# Patient Record
Sex: Female | Born: 1975 | Race: White | Hispanic: No | Smoking: Current some day smoker
Health system: Southern US, Community
[De-identification: ages and names within clinical notes are randomized; demographics above are authoritative.]

## PROBLEM LIST (undated history)

## (undated) DIAGNOSIS — E785 Hyperlipidemia, unspecified: Secondary | ICD-10-CM

## (undated) DIAGNOSIS — M5126 Other intervertebral disc displacement, lumbar region: Secondary | ICD-10-CM

## (undated) DIAGNOSIS — M1990 Unspecified osteoarthritis, unspecified site: Secondary | ICD-10-CM

## (undated) DIAGNOSIS — F4310 Post-traumatic stress disorder, unspecified: Secondary | ICD-10-CM

## (undated) DIAGNOSIS — F319 Bipolar disorder, unspecified: Secondary | ICD-10-CM

## (undated) DIAGNOSIS — F329 Major depressive disorder, single episode, unspecified: Secondary | ICD-10-CM

## (undated) DIAGNOSIS — A048 Other specified bacterial intestinal infections: Secondary | ICD-10-CM

## (undated) DIAGNOSIS — K219 Gastro-esophageal reflux disease without esophagitis: Secondary | ICD-10-CM

## (undated) DIAGNOSIS — J309 Allergic rhinitis, unspecified: Secondary | ICD-10-CM

## (undated) DIAGNOSIS — G43909 Migraine, unspecified, not intractable, without status migrainosus: Secondary | ICD-10-CM

## (undated) DIAGNOSIS — M4800 Spinal stenosis, site unspecified: Secondary | ICD-10-CM

## (undated) DIAGNOSIS — R102 Pelvic and perineal pain: Secondary | ICD-10-CM

## (undated) DIAGNOSIS — F419 Anxiety disorder, unspecified: Secondary | ICD-10-CM

## (undated) DIAGNOSIS — F32A Depression, unspecified: Secondary | ICD-10-CM

## (undated) DIAGNOSIS — M549 Dorsalgia, unspecified: Secondary | ICD-10-CM

## (undated) DIAGNOSIS — G8929 Other chronic pain: Secondary | ICD-10-CM

## (undated) DIAGNOSIS — N809 Endometriosis, unspecified: Secondary | ICD-10-CM

## (undated) DIAGNOSIS — M2550 Pain in unspecified joint: Secondary | ICD-10-CM

## (undated) DIAGNOSIS — M542 Cervicalgia: Secondary | ICD-10-CM

## (undated) DIAGNOSIS — M5430 Sciatica, unspecified side: Secondary | ICD-10-CM

## (undated) HISTORY — PX: SHOULDER SURGERY: SHX246

## (undated) HISTORY — PX: GALLBLADDER SURGERY: SHX652

## (undated) HISTORY — PX: ABDOMINAL HYSTERECTOMY: SHX81

## (undated) HISTORY — PX: CHOLECYSTECTOMY: SHX55

## (undated) HISTORY — DX: Unspecified osteoarthritis, unspecified site: M19.90

## (undated) HISTORY — PX: CERVICAL FUSION: SHX112

---

## 2005-01-21 HISTORY — PX: OTHER SURGICAL HISTORY: SHX169

## 2013-09-28 ENCOUNTER — Emergency Department (HOSPITAL_COMMUNITY)
Admission: EM | Admit: 2013-09-28 | Discharge: 2013-09-28 | Disposition: A | Discharge status: Home / Self Care | Payer: Medicaid Other | Attending: Emergency Medicine | Admitting: Emergency Medicine

## 2013-09-28 ENCOUNTER — Encounter (HOSPITAL_COMMUNITY): Payer: Self-pay | Admitting: Emergency Medicine

## 2013-09-28 DIAGNOSIS — M545 Low back pain, unspecified: Secondary | ICD-10-CM | POA: Diagnosis not present

## 2013-09-28 DIAGNOSIS — IMO0002 Reserved for concepts with insufficient information to code with codable children: Secondary | ICD-10-CM | POA: Insufficient documentation

## 2013-09-28 DIAGNOSIS — Z8742 Personal history of other diseases of the female genital tract: Secondary | ICD-10-CM | POA: Diagnosis not present

## 2013-09-28 DIAGNOSIS — Z862 Personal history of diseases of the blood and blood-forming organs and certain disorders involving the immune mechanism: Secondary | ICD-10-CM | POA: Diagnosis not present

## 2013-09-28 DIAGNOSIS — Z79899 Other long term (current) drug therapy: Secondary | ICD-10-CM | POA: Diagnosis not present

## 2013-09-28 DIAGNOSIS — F329 Major depressive disorder, single episode, unspecified: Secondary | ICD-10-CM | POA: Insufficient documentation

## 2013-09-28 DIAGNOSIS — Z791 Long term (current) use of non-steroidal anti-inflammatories (NSAID): Secondary | ICD-10-CM | POA: Insufficient documentation

## 2013-09-28 DIAGNOSIS — Z8619 Personal history of other infectious and parasitic diseases: Secondary | ICD-10-CM | POA: Diagnosis not present

## 2013-09-28 DIAGNOSIS — Z8739 Personal history of other diseases of the musculoskeletal system and connective tissue: Secondary | ICD-10-CM | POA: Diagnosis not present

## 2013-09-28 DIAGNOSIS — Z8719 Personal history of other diseases of the digestive system: Secondary | ICD-10-CM | POA: Diagnosis not present

## 2013-09-28 DIAGNOSIS — F17200 Nicotine dependence, unspecified, uncomplicated: Secondary | ICD-10-CM | POA: Insufficient documentation

## 2013-09-28 DIAGNOSIS — F411 Generalized anxiety disorder: Secondary | ICD-10-CM | POA: Diagnosis not present

## 2013-09-28 DIAGNOSIS — G43909 Migraine, unspecified, not intractable, without status migrainosus: Secondary | ICD-10-CM | POA: Insufficient documentation

## 2013-09-28 DIAGNOSIS — F3289 Other specified depressive episodes: Secondary | ICD-10-CM | POA: Insufficient documentation

## 2013-09-28 DIAGNOSIS — G8929 Other chronic pain: Secondary | ICD-10-CM | POA: Diagnosis not present

## 2013-09-28 DIAGNOSIS — Z8639 Personal history of other endocrine, nutritional and metabolic disease: Secondary | ICD-10-CM | POA: Insufficient documentation

## 2013-09-28 HISTORY — DX: Anxiety disorder, unspecified: F41.9

## 2013-09-28 HISTORY — DX: Sciatica, unspecified side: M54.30

## 2013-09-28 HISTORY — DX: Migraine, unspecified, not intractable, without status migrainosus: G43.909

## 2013-09-28 HISTORY — DX: Pain in unspecified joint: M25.50

## 2013-09-28 HISTORY — DX: Dorsalgia, unspecified: M54.9

## 2013-09-28 HISTORY — DX: Depression, unspecified: F32.A

## 2013-09-28 HISTORY — DX: Other intervertebral disc displacement, lumbar region: M51.26

## 2013-09-28 HISTORY — DX: Cervicalgia: M54.2

## 2013-09-28 HISTORY — DX: Hyperlipidemia, unspecified: E78.5

## 2013-09-28 HISTORY — DX: Spinal stenosis, site unspecified: M48.00

## 2013-09-28 HISTORY — DX: Major depressive disorder, single episode, unspecified: F32.9

## 2013-09-28 HISTORY — DX: Other specified bacterial intestinal infections: A04.8

## 2013-09-28 HISTORY — DX: Endometriosis, unspecified: N80.9

## 2013-09-28 HISTORY — DX: Other chronic pain: G89.29

## 2013-09-28 HISTORY — DX: Allergic rhinitis, unspecified: J30.9

## 2013-09-28 HISTORY — DX: Gastro-esophageal reflux disease without esophagitis: K21.9

## 2013-09-28 HISTORY — DX: Unspecified osteoarthritis, unspecified site: M19.90

## 2013-09-28 HISTORY — DX: Pelvic and perineal pain: R10.2

## 2013-09-28 MED ORDER — OXYCODONE-ACETAMINOPHEN 5-325 MG PO TABS
1.0000 | ORAL_TABLET | Freq: Once | ORAL | Status: AC
  Administered 2013-09-28: 1 via ORAL
  Filled 2013-09-28: qty 1

## 2013-09-28 MED ORDER — DIAZEPAM 5 MG PO TABS
5.0000 mg | ORAL_TABLET | Freq: Once | ORAL | Status: AC
  Administered 2013-09-28: 5 mg via ORAL
  Filled 2013-09-28: qty 1

## 2013-09-28 MED ORDER — OXYCODONE-ACETAMINOPHEN 5-325 MG PO TABS: 1.0000 | ORAL_TABLET | Freq: Once | ORAL | Status: DC

## 2013-09-28 MED ORDER — CYCLOBENZAPRINE HCL 10 MG PO TABS: 10.0000 mg | ORAL_TABLET | Freq: Two times a day (BID) | ORAL | Status: DC | PRN

## 2013-09-28 NOTE — ED Notes (Signed)
The patient says she has several herniated discs, and has chronic back pain.  She says she has been to urgent care for the back pain and they gave her steroids.  She says this time it is different.  She also say she has had incontinence of urine and stool.  She rates her pain 10/10.  She says she takes ibuprofen and it is not working.  She also says she was given hyrdocodone and that did not work.

## 2013-09-28 NOTE — Discharge Instructions (Signed)
Return to the emergency room with worsening of symptoms, new symptoms or with symptoms that are concerning, severe back pain, worsening loss of bladder or bowel, weakness, numbness and tingling. Follow up with orthopedist if symptoms are persistent. Take flexeril for severe pain Do not operate machinery, drive or drink alcohol while taking narcotics or muscle relaxants.  Back Pain:  Your back pain should be treated with medicines such as ibuprofen or aleve and this back pain should get better over the next 2 weeks.  However if you develop severe or worsening pain, low back pain with fever, numbness, weakness or inability to walk or urinate, you should return to the ER immediately.  Please follow up with your doctor this week for a recheck if still having symptoms. Low back pain is discomfort in the lower back that may be due to injuries to muscles and ligaments around the spine.  Occasionally, it may be caused by a a problem to a part of the spine called a disc.  The pain may last several days or a week;  However, most patients get completely well in 4 weeks.  Self - care:  The application of heat can help soothe the pain.  Maintaining your daily activities, including walking, is encourged, as it will help you get better faster than just staying in bed.  Medications are also useful to help with pain control.  A commonly prescribed medications includes acetaminophen.  This medication is generally safe, though you should not take more than 8 of the extra strength ( ) pills a day.  Non steroidal anti inflammatory medications including Ibuprofen and naproxen;  These medications help both pain and swelling and are very useful in treating back pain.  They should be taken with food, as they can cause stomach upset, and more seriously, stomach bleeding.    Muscle relaxants:  These medications can help with muscle tightness that is a cause of lower back pain.  Most of these medications can cause  drowsiness, and it is not safe to drive or use dangerous machinery while taking them.  You will need to follow up with  Your primary healthcare provider in 1-2 weeks for reassessment.  Be aware that if you develop new symptoms, such as a fever, leg weakness, difficulty with or loss of control of your urine or bowels, abdominal pain, or more severe pain, you will need to seek medical attention and  / or return to the Emergency department.

## 2013-09-28 NOTE — ED Provider Notes (Signed)
CSN: [id number]     Arrival date & time 09/28/13  1202 History   First MD Initiated Contact with Patient 09/28/13 1702     Chief Complaint  Patient presents with  . Back Pain    The patient says she has several herniated discs, and has chronic back pain.  She says she has been to urgent care for the back pain and they gave her steroids.  She says this time it is different.     HPI Nancy Mcpherson is a 38 y.o. female with PMH of chronic back pain, several herniated discs 01/2013, spinal stenosis, DJD, anxiety and depression presenting with chronic back pain that is worse than normal. Patient states that the pain is different in that she feels her bones are "stacked on top of each other." She was seen by urgent care who gave her steroids. She has been using ibuprofen, hydrocodone without relief. Patient also complains of mild urinary and fecal incontinence that has been going on since 2011. Patient states incontinence is worse but cannot specify in what way. Incontinence happens at rest, with activity and is enough to soak her underwear but she does not completely loose all contents of her bladder. Patient denies urinary retention. Patient denies dysuria, frequency, hematuria or foul odor to urine. Patient endorses some numbness in toes. Patient denies saddle anesthesia, weakness, fevers, chills, weight loss or night sweats or h/o cancer. Patient denies any abdominal pain. Patient is new to the area and without a PCP. Patient denies illicit drug use or IVDU. Patient denies any recent back injury or trauma or MVC.   Past Medical History  Diagnosis Date  . Depression   . Migraine   . Anxiety   . Allergic rhinitis   . GERD (gastroesophageal reflux disease)   . Endometriosis   . H. pylori infection   . Pelvic pain in female   . Neck pain   . Chronic back pain   . DJD (degenerative joint disease)   . Arthralgia   . Sciatica   . Hyperlipidemia   . Spinal stenosis   . Lumbar disc herniation     Past Surgical History  Procedure Laterality Date  . Abdominal hysterectomy    . Cesarean section    . Cholecystectomy     History reviewed. No pertinent family history. History  Substance Use Topics  . Smoking status: Current Every Day Smoker -- 1.00 packs/day    Types: Cigarettes  . Smokeless tobacco: Never Used  . Alcohol Use: No   OB History   Grav Para Term Preterm Abortions TAB SAB Ect Mult Living                 Review of Systems  Constitutional: Negative for fever and chills.  HENT: Negative for congestion and rhinorrhea.   Eyes: Negative for visual disturbance.  Respiratory: Negative for cough and shortness of breath.   Cardiovascular: Negative for chest pain and palpitations.  Gastrointestinal: Negative for nausea, vomiting and diarrhea.  Genitourinary: Negative for dysuria and hematuria.  Musculoskeletal: Positive for back pain. Negative for gait problem.  Skin: Negative for rash.  Neurological: Negative for weakness and headaches.      Allergies  Review of patient's allergies indicates no known allergies.  Home Medications   Prior to Admission medications   Medication Sig Start Date End Date Taking? Authorizing Provider  B Complex-C (SUPER B COMPLEX PO) Take 1 tablet by mouth daily.   Yes Historical Provider, MD  cholecalciferol (VITAMIN D)  1000 UNITS tablet Take 1,000 Units by mouth daily.   Yes Historical Provider, MD  FLUoxetine (PROZAC) 20 MG tablet Take 20 mg by mouth 3 (three) times daily.   Yes Historical Provider, MD  gabapentin (NEURONTIN) 300 MG capsule Take 300 mg by mouth 3 (three) times daily.   Yes Historical Provider, MD  HYDROcodone-acetaminophen (NORCO/VICODIN) 5-325 MG per tablet Take 1 tablet by mouth every 6 (six) hours as needed for moderate pain.   Yes Historical Provider, MD  ibuprofen (ADVIL,MOTRIN) 800 MG tablet Take 800 mg by mouth 2 (two) times daily. Take two every day per patient   Yes Historical Provider, MD  lamoTRIgine  (LAMICTAL) 150 MG tablet Take 150 mg by mouth daily.   Yes Historical Provider, MD  prazosin (MINIPRESS) 1 MG capsule Take 1 mg by mouth at bedtime.   Yes Historical Provider, MD  predniSONE (DELTASONE) 10 MG tablet Take 10 mg by mouth daily with breakfast.   Yes Historical Provider, MD  QUEtiapine (SEROQUEL) 100 MG tablet Take 100 mg by mouth at bedtime.   Yes Historical Provider, MD  QUEtiapine (SEROQUEL) 25 MG tablet Take 25 mg by mouth daily as needed.    Yes Historical Provider, MD  SUMAtriptan (IMITREX) 50 MG tablet Take 50 mg by mouth every 2 (two) hours as needed for headache. May repeat in 2 hours if headache persists or recurs.   Yes Historical Provider, MD  topiramate (TOPAMAX) 100 MG tablet Take 100 mg by mouth at bedtime.   Yes Historical Provider, MD  cyclobenzaprine (FLEXERIL) 10 MG tablet Take 1 tablet (10 mg total) by mouth 2 (two) times daily as needed for muscle spasms. 09/28/13   Benetta Spar L Khaniya Tenaglia, PA-C   BP 109/61  Pulse 78  Temp(Src) 98 F (36.7 C) (Oral)  Resp 16  SpO2 100% Physical Exam  Nursing note and vitals reviewed. Constitutional: She appears well-developed and well-nourished. No distress.  HENT:  Head: Normocephalic and atraumatic.  Eyes: Conjunctivae and EOM are normal. Right eye exhibits no discharge. Left eye exhibits no discharge. No scleral icterus.  Cardiovascular: Normal rate, regular rhythm and normal heart sounds.   Pulmonary/Chest: Effort normal and breath sounds normal. No respiratory distress. She has no wheezes.  Abdominal: Soft. Bowel sounds are normal. She exhibits no distension. There is no tenderness.  Musculoskeletal: Normal range of motion.  Patient without midline tenderness, crepitus or step off. Patient with right sided low back pain. Negative straight leg test. Patient with normal muscle tone, 5/5 strength in lower extremities. Sensation intact. DTR intact. Pedal pulses 2+. No CVA tenderness.  Neurological: She is alert. She exhibits  normal muscle tone. Coordination normal.  Skin: Skin is warm and dry. She is not diaphoretic.  Psychiatric: She has a normal mood and affect. Her behavior is normal.    ED Course  Procedures (including critical care time) Labs Review Labs Reviewed - No data to display  Imaging Review No results found.   EKG Interpretation None     Meds given in ED:  Medications  oxyCODONE-acetaminophen (PERCOCET/ROXICET) 5-325 MG per tablet 1 tablet (1 tablet Oral Given 09/28/13 1756)  diazepam (VALIUM) tablet 5 mg (5 mg Oral Given 09/28/13 1857)    Discharge Medication List as of 09/28/2013  7:12 PM    START taking these medications   Details  cyclobenzaprine (FLEXERIL) 10 MG tablet Take 1 tablet (10 mg total) by mouth 2 (two) times daily as needed for muscle spasms., Starting 09/28/2013, Until Discontinued, Print  MDM   Final diagnoses:  Chronic lower back pain   Nancy Mcpherson is a 38 y.o. female with PMH of chronic back pain, depression, anxiety presenting with back pain and urinary and fecal incontinence that has been ongoing since 2011. She says the incontinence is worse today but cannot specify in what way.  Incontinence not concerning for cauda equina syndrome due to its long standing duration and no new back injury. Patient is ambulatory, without saddle anesthesia, weakness. No fever, night sweats, weight loss, h/o cancer, IVDU.  VSS. No neurological deficits and normal neuro exam.  Patient can walk but states is painful. Patient given pain meds in ED with improvement of pain. RICE protocol and NSAIDs indicated and discussed with patient. Trial of flexeril. Sedation and driving precautions provided. Follow up with orthopedist for persistent symptoms. Patient without PCP and encouraged to establish care. ED resources provided.  Discussed return precautions with patient. Discussed all results and patient verbalizes understanding and agrees with plan.  Case has been discussed with  Dr. Gwendolyn Grant who agrees with the above plan to discharge.      Louann Sjogren, PA-C 09/29/13 1158

## 2013-09-29 NOTE — ED Provider Notes (Signed)
Medical screening examination/treatment/procedure(s) were performed by non-physician practitioner and as supervising physician I was immediately available for consultation/collaboration.   EKG Interpretation None        Elwin Mocha, MD 09/29/13 [phone]

## 2013-11-25 ENCOUNTER — Encounter (HOSPITAL_COMMUNITY): Payer: Self-pay | Admitting: *Deleted

## 2013-11-25 ENCOUNTER — Inpatient Hospital Stay (HOSPITAL_COMMUNITY): Payer: Medicaid Other

## 2013-11-25 ENCOUNTER — Emergency Department (HOSPITAL_COMMUNITY): Payer: Medicaid Other

## 2013-11-25 ENCOUNTER — Inpatient Hospital Stay (HOSPITAL_COMMUNITY)
Admission: EM | Admit: 2013-11-25 | Discharge: 2013-11-26 | DRG: 070 | Disposition: A | Discharge status: Home / Self Care | Payer: Medicaid Other | Attending: Pulmonary Disease | Admitting: Pulmonary Disease

## 2013-11-25 DIAGNOSIS — R4182 Altered mental status, unspecified: Secondary | ICD-10-CM

## 2013-11-25 DIAGNOSIS — F17210 Nicotine dependence, cigarettes, uncomplicated: Secondary | ICD-10-CM | POA: Diagnosis present

## 2013-11-25 DIAGNOSIS — R110 Nausea: Secondary | ICD-10-CM | POA: Diagnosis not present

## 2013-11-25 DIAGNOSIS — F3130 Bipolar disorder, current episode depressed, mild or moderate severity, unspecified: Secondary | ICD-10-CM | POA: Diagnosis present

## 2013-11-25 DIAGNOSIS — J9601 Acute respiratory failure with hypoxia: Secondary | ICD-10-CM

## 2013-11-25 DIAGNOSIS — R4020 Unspecified coma: Secondary | ICD-10-CM | POA: Diagnosis present

## 2013-11-25 DIAGNOSIS — G9340 Encephalopathy, unspecified: Secondary | ICD-10-CM | POA: Diagnosis present

## 2013-11-25 DIAGNOSIS — R000 Tachycardia, unspecified: Secondary | ICD-10-CM | POA: Diagnosis present

## 2013-11-25 DIAGNOSIS — J9600 Acute respiratory failure, unspecified whether with hypoxia or hypercapnia: Secondary | ICD-10-CM | POA: Diagnosis present

## 2013-11-25 DIAGNOSIS — F4310 Post-traumatic stress disorder, unspecified: Secondary | ICD-10-CM | POA: Diagnosis present

## 2013-11-25 DIAGNOSIS — T1490XA Injury, unspecified, initial encounter: Secondary | ICD-10-CM | POA: Insufficient documentation

## 2013-11-25 DIAGNOSIS — R55 Syncope and collapse: Secondary | ICD-10-CM

## 2013-11-25 DIAGNOSIS — I10 Essential (primary) hypertension: Secondary | ICD-10-CM | POA: Diagnosis present

## 2013-11-25 DIAGNOSIS — E876 Hypokalemia: Secondary | ICD-10-CM | POA: Diagnosis present

## 2013-11-25 DIAGNOSIS — J019 Acute sinusitis, unspecified: Secondary | ICD-10-CM | POA: Diagnosis present

## 2013-11-25 DIAGNOSIS — R40243 Glasgow coma scale score 3-8, unspecified time: Secondary | ICD-10-CM

## 2013-11-25 DIAGNOSIS — R569 Unspecified convulsions: Secondary | ICD-10-CM | POA: Diagnosis present

## 2013-11-25 HISTORY — DX: Bipolar disorder, unspecified: F31.9

## 2013-11-25 HISTORY — DX: Post-traumatic stress disorder, unspecified: F43.10

## 2013-11-25 LAB — I-STAT ARTERIAL BLOOD GAS, ED
Acid-base deficit: 2 mmol/L (ref 0.0–2.0)
BICARBONATE: 24.2 meq/L — AB (ref 20.0–24.0)
O2 Saturation: 100 %
TCO2: 26 mmol/L (ref 0–100)
pCO2 arterial: 46.2 mmHg — ABNORMAL HIGH (ref 35.0–45.0)
pH, Arterial: 7.327 — ABNORMAL LOW (ref 7.350–7.450)
pO2, Arterial: 491 mmHg — ABNORMAL HIGH (ref 80.0–100.0)

## 2013-11-25 LAB — URINALYSIS, ROUTINE W REFLEX MICROSCOPIC
Glucose, UA: NEGATIVE mg/dL
Hgb urine dipstick: NEGATIVE
Ketones, ur: NEGATIVE mg/dL
Leukocytes, UA: NEGATIVE
NITRITE: NEGATIVE
Protein, ur: NEGATIVE mg/dL
SPECIFIC GRAVITY, URINE: 1.035 — AB (ref 1.005–1.030)
UROBILINOGEN UA: 0.2 mg/dL (ref 0.0–1.0)
pH: 5 (ref 5.0–8.0)

## 2013-11-25 LAB — TYPE AND SCREEN
ABO/RH(D): O POS
Antibody Screen: NEGATIVE
Unit division: 0
Unit division: 0

## 2013-11-25 LAB — I-STAT CHEM 8, ED
BUN: 4 mg/dL — AB (ref 6–23)
CALCIUM ION: 1.02 mmol/L — AB (ref 1.12–1.23)
CREATININE: 0.6 mg/dL (ref 0.50–1.10)
Chloride: 107 mEq/L (ref 96–112)
GLUCOSE: 107 mg/dL — AB (ref 70–99)
HCT: 45 % (ref 36.0–46.0)
Hemoglobin: 15.3 g/dL — ABNORMAL HIGH (ref 12.0–15.0)
Potassium: 2.9 mEq/L — CL (ref 3.7–5.3)
Sodium: 140 mEq/L (ref 137–147)
TCO2: 19 mmol/L (ref 0–100)

## 2013-11-25 LAB — ACETAMINOPHEN LEVEL: Acetaminophen (Tylenol), Serum: <15 ug/mL (ref 10–30)

## 2013-11-25 LAB — PHOSPHORUS: Phosphorus: 3.1 mg/dL (ref 2.3–4.6)

## 2013-11-25 LAB — MAGNESIUM: Magnesium: 1.9 mg/dL (ref 1.5–2.5)

## 2013-11-25 LAB — TROPONIN I

## 2013-11-25 LAB — SALICYLATE LEVEL: Salicylate Lvl: <2 mg/dL — ABNORMAL LOW (ref 2.8–20.0)

## 2013-11-25 LAB — MRSA PCR SCREENING: MRSA BY PCR: NEGATIVE

## 2013-11-25 LAB — LACTIC ACID, PLASMA: Lactic Acid, Venous: 1.3 mmol/L (ref 0.5–2.2)

## 2013-11-25 LAB — I-STAT CG4 LACTIC ACID, ED: Lactic Acid, Venous: 1.05 mmol/L (ref 0.5–2.2)

## 2013-11-25 LAB — ABO/RH: ABO/RH(D): O POS

## 2013-11-25 MED ORDER — FENTANYL CITRATE 0.05 MG/ML IJ SOLN
50.0000 ug | Freq: Once | INTRAMUSCULAR | Status: AC
  Administered 2013-11-25: 50 ug via INTRAVENOUS

## 2013-11-25 MED ORDER — HYDROMORPHONE HCL 1 MG/ML IJ SOLN
1.0000 mg | Freq: Once | INTRAMUSCULAR | Status: AC
  Administered 2013-11-25: 1 mg via INTRAVENOUS
  Filled 2013-11-25: qty 1

## 2013-11-25 MED ORDER — GADOBENATE DIMEGLUMINE 529 MG/ML IV SOLN
15.0000 mL | Freq: Once | INTRAVENOUS | Status: AC | PRN
  Administered 2013-11-25: 15 mL via INTRAVENOUS

## 2013-11-25 MED ORDER — SODIUM CHLORIDE 0.9 % IV SOLN: 250.0000 mL | INTRAVENOUS | Status: DC | PRN

## 2013-11-25 MED ORDER — IOHEXOL 300 MG/ML  SOLN
80.0000 mL | Freq: Once | INTRAMUSCULAR | Status: AC | PRN
  Administered 2013-11-25: 80 mL via INTRAVENOUS

## 2013-11-25 MED ORDER — FENTANYL CITRATE 0.05 MG/ML IJ SOLN
INTRAMUSCULAR | Status: AC
  Filled 2013-11-25: qty 2

## 2013-11-25 MED ORDER — ROCURONIUM BROMIDE 50 MG/5ML IV SOLN
INTRAVENOUS | Status: AC | PRN
  Administered 2013-11-25: 100 mg via INTRAVENOUS

## 2013-11-25 MED ORDER — CHLORHEXIDINE GLUCONATE 0.12 % MT SOLN
15.0000 mL | Freq: Two times a day (BID) | OROMUCOSAL | Status: DC
  Administered 2013-11-26 (×2): 15 mL via OROMUCOSAL
  Filled 2013-11-25 (×2): qty 15

## 2013-11-25 MED ORDER — SODIUM CHLORIDE 0.9 % IV SOLN
INTRAVENOUS | Status: DC
  Administered 2013-11-25: 75 mL/h via INTRAVENOUS
  Administered 2013-11-26: 09:00:00 via INTRAVENOUS

## 2013-11-25 MED ORDER — FENTANYL CITRATE 0.05 MG/ML IJ SOLN
INTRAMUSCULAR | Status: AC | PRN
  Administered 2013-11-25: 50 ug via INTRAVENOUS

## 2013-11-25 MED ORDER — CETYLPYRIDINIUM CHLORIDE 0.05 % MT LIQD
7.0000 mL | Freq: Four times a day (QID) | OROMUCOSAL | Status: DC
  Administered 2013-11-26 (×2): 7 mL via OROMUCOSAL

## 2013-11-25 MED ORDER — MIDAZOLAM HCL 2 MG/2ML IJ SOLN
INTRAMUSCULAR | Status: AC
  Administered 2013-11-25: 4 mg via INTRAVENOUS
  Filled 2013-11-25: qty 4

## 2013-11-25 MED ORDER — POTASSIUM CHLORIDE 10 MEQ/100ML IV SOLN
10.0000 meq | Freq: Once | INTRAVENOUS | Status: AC
  Administered 2013-11-25: 10 meq via INTRAVENOUS
  Filled 2013-11-25: qty 100

## 2013-11-25 MED ORDER — PROPOFOL 10 MG/ML IV EMUL
5.0000 ug/kg/min | INTRAVENOUS | Status: DC
  Administered 2013-11-25: 30 ug/kg/min via INTRAVENOUS
  Administered 2013-11-25: 15 ug/kg/min via INTRAVENOUS
  Filled 2013-11-25: qty 100

## 2013-11-25 MED ORDER — MIDAZOLAM HCL 2 MG/2ML IJ SOLN
INTRAMUSCULAR | Status: AC
  Filled 2013-11-25: qty 4

## 2013-11-25 MED ORDER — PANTOPRAZOLE SODIUM 40 MG IV SOLR
40.0000 mg | Freq: Every day | INTRAVENOUS | Status: DC
  Administered 2013-11-26: 40 mg via INTRAVENOUS
  Filled 2013-11-25 (×2): qty 40

## 2013-11-25 MED ORDER — MIDAZOLAM HCL 5 MG/5ML IJ SOLN
4.0000 mg | Freq: Once | INTRAMUSCULAR | Status: AC
  Administered 2013-11-25 (×2): 4 mg via INTRAVENOUS

## 2013-11-25 MED ORDER — PROPOFOL 10 MG/ML IV EMUL
INTRAVENOUS | Status: AC
  Filled 2013-11-25: qty 100

## 2013-11-25 MED ORDER — LORAZEPAM 2 MG/ML IJ SOLN
5.0000 mg | Freq: Once | INTRAMUSCULAR | Status: DC
  Filled 2013-11-25: qty 3

## 2013-11-25 MED ORDER — ETOMIDATE 2 MG/ML IV SOLN
INTRAVENOUS | Status: AC | PRN
  Administered 2013-11-25: 20 mg via INTRAVENOUS

## 2013-11-25 NOTE — ED Notes (Addendum)
To CT with RN and MD after X-rays noted to be neg

## 2013-11-25 NOTE — ED Notes (Signed)
Preparing to intubate.  
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
## 2013-11-25 NOTE — Progress Notes (Signed)
eLink Physician-Brief Progress Note Patient Name: Nancy Mcpherson DOB: [birth year] MRN: [id number]   Date of Service  11/25/2013  HPI/Events of Note  Back from MRI, read pending but no gross abnormality per my read Awake and following commands Passing SBT  eICU Interventions  Extubate now     Intervention Category Major Interventions: Respiratory failure - evaluation and management  MCQUAID, DOUGLAS 11/25/2013, 11:44 PM
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
## 2013-11-25 NOTE — ED Notes (Signed)
Pt transported to CT ?

## 2013-11-25 NOTE — ED Provider Notes (Signed)
CSN: [id number]     Arrival date & time 11/25/13  1500 History   None    No chief complaint on file.    (Consider location/radiation/quality/duration/timing/severity/associated sxs/prior Treatment) HPI Comments: 38 y/o female found down outdoors at home. Reported in normal state of health prior. GCS 3 on arrival with shallow respirations. Wife reports that pt has history of depression and PTSD but otherwise no medical problems. Level 5 caveat  The history is provided by the EMS personnel. No language interpreter was used.    No past medical history on file. No past surgical history on file. No family history on file. History  Substance Use Topics  . Smoking status: Not on file  . Smokeless tobacco: Not on file  . Alcohol Use: Not on file   OB History    No data available     Review of Systems  Unable to perform ROS: Patient unresponsive      Allergies  Review of patient's allergies indicates not on file.  Home Medications   Prior to Admission medications   Not on File   BP 132/93 mmHg  Pulse 117  Temp(Src) 98.2 F (36.8 C) (Temporal)  Resp 25  Ht 5\' 5"  (1.651 m)  Wt 160 lb (72.576 kg)  BMI 26.63 kg/m2  SpO2 100%  LMP  (LMP Unknown) Physical Exam  Constitutional: She appears well-developed.  HENT:  Mouth/Throat: Oropharynx is clear and moist.  Abrasion to right forehead  Eyes: Pupils are equal, round, and reactive to light.  Left gaze deviation with opening eyes. fasiculations of eyelids  Neck: No tracheal deviation present.  Cardiovascular: Regular rhythm.  Tachycardia present.   Pulmonary/Chest:  Breath sounds equal, diminished. No crepitus to chest wall  Abdominal: Soft. She exhibits no distension.  Musculoskeletal:  No step offs of t/l spine. Cervical collar in place. Pelvis stable to compression. No deformity of extremities  Neurological: She is unresponsive. GCS eye subscore is 1. GCS verbal subscore is 1. GCS motor subscore is 1.  Skin: Skin is  warm.  Abrasion to right elbow    ED Course  INTUBATION Date/Time: 11/25/2013 3:15 PM Performed by: Abagail KitchensAYLOR, Arran Fessel Authorized by: Abagail KitchensAYLOR, Vicent Febles Consent: The procedure was performed in an emergent situation. Patient identity confirmed: arm band Time out: Immediately prior to procedure a "time out" was called to verify the correct patient, procedure, equipment, support staff and site/side marked as required. Indications: airway protection Intubation method: video-assisted Patient status: paralyzed (RSI) Preoxygenation: nonrebreather mask Sedatives: etomidate Paralytic: rocuronium Tube size: 7.5 mm Tube type: cuffed Number of attempts: 1 Cords visualized: yes Post-procedure assessment: chest rise and CO2 detector Breath sounds: equal and absent over the epigastrium Cuff inflated: yes ETT to teeth: 24 cm Tube secured with: ETT holder Chest x-ray interpreted by me. Chest x-ray findings: endotracheal tube too low Tube repositioned: tube repositioned successfully Patient tolerance: Patient tolerated the procedure well with no immediate complications  CRITICAL CARE Performed by: Abagail KitchensAYLOR, Kerrigan Gombos Authorized by: Abagail KitchensAYLOR, Lyal Husted Total critical care time: 30 minutes Critical care time was exclusive of separately billable procedures and treating other patients. Critical care was necessary to treat or prevent imminent or life-threatening deterioration of the following conditions: CNS failure or compromise. Critical care was time spent personally by me on the following activities: discussions with consultants, evaluation of patient's response to treatment, examination of patient, ordering and performing treatments and interventions, ordering and review of laboratory studies, ordering and review of radiographic studies and re-evaluation of patient's condition.   (including  critical care time) Labs Review Labs Reviewed  I-STAT CHEM 8, ED - Abnormal; Notable for the following:    Potassium 2.9 (*)     BUN 4 (*)    Glucose, Bld 107 (*)    Calcium, Ion 1.02 (*)    Hemoglobin 15.3 (*)    All other components within normal limits  TYPE AND SCREEN  PREPARE FRESH FROZEN PLASMA    Imaging Review Ct Head Wo Contrast  11/25/2013   CLINICAL DATA:  Found down, soft tissue swelling/ hematoma to right forehead  EXAM: CT HEAD WITHOUT CONTRAST  CT CERVICAL SPINE WITHOUT CONTRAST  TECHNIQUE: Multidetector CT imaging of the head and cervical spine was performed following the standard protocol without intravenous contrast. Multiplanar CT image reconstructions of the cervical spine were also generated.  COMPARISON:  None.  FINDINGS: CT HEAD FINDINGS  No evidence of parenchymal hemorrhage or extra-axial fluid collection. No mass lesion, mass effect, or midline shift.  No CT evidence of acute infarction.  Cerebral volume is within normal limits.  No ventriculomegaly.  Partial opacification/ mucous retention cyst in the right maxillary sinus. The visualized paranasal sinuses are otherwise essentially clear. The mastoid air cells are unopacified.  No evidence of calvarial fracture.  CT CERVICAL SPINE FINDINGS  Straightening of the cervical spine.  No evidence of fracture or dislocation. Vertebral body heights are maintained. Dens appears intact.  No prevertebral soft tissue swelling.  Mild degenerative changes at C5-6.  Visualized thyroid is unremarkable.  Visualized lung apices are clear.  IMPRESSION: Normal head CT.  Normal cervical spine CT.   Electronically Signed   By: Charline BillsSriyesh  Krishnan M.D.   On: 11/25/2013 16:10   Ct Chest W Contrast  11/25/2013   CLINICAL DATA:  Head trauma. Hematoma to the forehead. The patient was found down.  EXAM: CT CHEST AND ABDOMEN WITH CONTRAST  TECHNIQUE: Multidetector CT imaging of the chest and abdomen was performed following the standard protocol during bolus administration of intravenous contrast.  CONTRAST:  80mL OMNIPAQUE IOHEXOL 300 MG/ML  SOLN  COMPARISON:  None.  FINDINGS: CT  CHEST FINDINGS  There is slight dependent atelectasis at the lung bases. There is a 6 x 9 mm nodular density at the left lung base laterally best seen on image 44 of series 202. This is probably inflammatory in origin but I recommend a follow-up CT in 6 months without contrast to determine ongoing stability. Lungs are otherwise clear.  Heart and other mediastinal structures are normal. Endotracheal tube and NG tube are in good position. Osseous structures are normal. No pneumothorax.  CT ABDOMEN FINDINGS  Liver, spleen, pancreas, adrenal glands, and kidneys are normal. Gallbladder has been removed. Biliary tree is normal. NG tube tip is in the antrum of the stomach. There is no free air or free fluid in the abdomen. Bowel appears normal. The appendix is not visible. Uterus has been removed. Ovaries are normal. Bladder is normal. No significant acute osseous abnormality. Degenerative disc disease at L5-S1.  IMPRESSION: 1. Minimal dependent atelectasis at the lung bases. 6 x 9 mm nodule at the left lung base is most likely inflammatory but I recommend a six-month CT scan follow-up without contrast. 2. Benign appearing abdomen.   Electronically Signed   By: Geanie CooleyJim  Maxwell M.D.   On: 11/25/2013 16:25   Ct Cervical Spine Wo Contrast  11/25/2013   CLINICAL DATA:  Found down, soft tissue swelling/ hematoma to right forehead  EXAM: CT HEAD WITHOUT CONTRAST  CT CERVICAL SPINE  WITHOUT CONTRAST  TECHNIQUE: Multidetector CT imaging of the head and cervical spine was performed following the standard protocol without intravenous contrast. Multiplanar CT image reconstructions of the cervical spine were also generated.  COMPARISON:  None.  FINDINGS: CT HEAD FINDINGS  No evidence of parenchymal hemorrhage or extra-axial fluid collection. No mass lesion, mass effect, or midline shift.  No CT evidence of acute infarction.  Cerebral volume is within normal limits.  No ventriculomegaly.  Partial opacification/ mucous retention cyst in  the right maxillary sinus. The visualized paranasal sinuses are otherwise essentially clear. The mastoid air cells are unopacified.  No evidence of calvarial fracture.  CT CERVICAL SPINE FINDINGS  Straightening of the cervical spine.  No evidence of fracture or dislocation. Vertebral body heights are maintained. Dens appears intact.  No prevertebral soft tissue swelling.  Mild degenerative changes at C5-6.  Visualized thyroid is unremarkable.  Visualized lung apices are clear.  IMPRESSION: Normal head CT.  Normal cervical spine CT.   Electronically Signed   By: Charline Bills M.D.   On: 11/25/2013 16:10   Ct Abdomen Pelvis W Contrast  11/25/2013   CLINICAL DATA:  Head trauma. Hematoma to the forehead. The patient was found down.  EXAM: CT CHEST AND ABDOMEN WITH CONTRAST  TECHNIQUE: Multidetector CT imaging of the chest and abdomen was performed following the standard protocol during bolus administration of intravenous contrast.  CONTRAST:  80mL OMNIPAQUE IOHEXOL 300 MG/ML  SOLN  COMPARISON:  None.  FINDINGS: CT CHEST FINDINGS  There is slight dependent atelectasis at the lung bases. There is a 6 x 9 mm nodular density at the left lung base laterally best seen on image 44 of series 202. This is probably inflammatory in origin but I recommend a follow-up CT in 6 months without contrast to determine ongoing stability. Lungs are otherwise clear.  Heart and other mediastinal structures are normal. Endotracheal tube and NG tube are in good position. Osseous structures are normal. No pneumothorax.  CT ABDOMEN FINDINGS  Liver, spleen, pancreas, adrenal glands, and kidneys are normal. Gallbladder has been removed. Biliary tree is normal. NG tube tip is in the antrum of the stomach. There is no free air or free fluid in the abdomen. Bowel appears normal. The appendix is not visible. Uterus has been removed. Ovaries are normal. Bladder is normal. No significant acute osseous abnormality. Degenerative disc disease at  L5-S1.  IMPRESSION: 1. Minimal dependent atelectasis at the lung bases. 6 x 9 mm nodule at the left lung base is most likely inflammatory but I recommend a six-month CT scan follow-up without contrast. 2. Benign appearing abdomen.   Electronically Signed   By: Geanie Cooley M.D.   On: 11/25/2013 16:25   Dg Pelvis Portable  11/25/2013   CLINICAL DATA:  Found unresponsive on the ground 1 hr ago.  EXAM: PORTABLE PELVIS 1-2 VIEWS  COMPARISON:  None.  FINDINGS: The upper portion the pelvis is not included. The included portions appear normal without fracture or dislocation.  IMPRESSION: Excluded upper pelvis with no abnormality seen.   Electronically Signed   By: Gordan Payment M.D.   On: 11/25/2013 15:35   Dg Chest Portable 1 View  11/25/2013   CLINICAL DATA:  ET tube placement.  Found unresponsive.  EXAM: PORTABLE CHEST - 1 VIEW  COMPARISON:  None.  FINDINGS: The endotracheal tube tip just above the level of the carina. There is a nasogastric tube with tip in the stomach. Normal heart size. No pleural effusion or  edema.  IMPRESSION: Satisfactory position of the ET tube with tip above the carina.   Electronically Signed   By: Signa Kell M.D.   On: 11/25/2013 15:49     EKG Interpretation   Date/Time:  Thursday November 25 2013 15:05:26 EST Ventricular Rate:  113 PR Interval:  171 QRS Duration: 77 QT Interval:  336 QTC Calculation: 461 R Axis:   33 Text Interpretation:  Sinus tachycardia Biatrial enlargement Anteroseptal  infarct, age indeterminate ST depression in Lateral leads Confirmed by  ZACKOWSKI  MD, SCOTT [phone]) on 11/25/2013 3:56:09 PM      MDM   Final diagnoses:  Glasgow coma scale total score 3-8    38 y/o female presenting unresponsive after found down outdoors, level 1 trauma. Last seen normal 15 min prior.  GCS 3. Intubated on arrival for airway protection. No hypotension or hypoxia. Minimal trauma on exam. Fast negative. CT head without acute abnormality. Suspect metabolic vs  seizure. Neurology evaluated and suspect seizure activity. Recommend EEG and MRI. Will admit to Critical Care.     Abagail Kitchens, MD 11/25/13 [phone]

## 2013-11-25 NOTE — H&P (Signed)
PULMONARY / CRITICAL CARE MEDICINE   Name: Nancy Mcpherson MRN: [id number] DOB: 01-Apr-1975    ADMISSION DATE:  11/25/2013  REFERRING MD :  EDP  CHIEF COMPLAINT:  AMS  INITIAL PRESENTATION: 38yo female with hx bipolar, depression, PTSD found down by partner outside.  GCS 3 on arrival to ER.  Intubated, PCCM called to admit.   STUDIES:  CT head 11/5>>>neg  CT chest 11/5>>> neg acute.  6x1049mm LLL nodule likely inflammatory  CT abd/pelvis 11/5>>> neg acute   SIGNIFICANT EVENTS:    HISTORY OF PRESENT ILLNESS:  38 yo female with hx bipolar, depression, PTSD found down by partner outside. She was in her usual state of health, working outside on their new home.  Her wife (who is an EMT) had not seen her for a little while and when she went to look for her, found her lying on her R side on the driveway with her flip flops thrown off to the sides.  Per EMS eyes were deviated up and to the left on EMS arrival.  No hx seizures.  Per wife no recent increased depression, very compliant with medications.  No recent c/o SOB, chest pain, headache, leg/calf pain, recent travel.  Did have c/o back pain over last few days.    PAST MEDICAL HISTORY :   has a past medical history of Bipolar 1 disorder, depressed; PTSD (post-traumatic stress disorder); and Migraine.  has past surgical history that includes Abdominal hysterectomy and Gallbladder surgery. Prior to Admission medications   Medication Sig Start Date End Date Taking? Authorizing Provider  etodolac (LODINE) 500 MG tablet Take 500 mg by mouth 2 (two) times daily.   Yes Historical Provider, MD  FLUoxetine (PROZAC) 20 MG capsule Take 60 mg by mouth every morning.   Yes Historical Provider, MD  gabapentin (NEURONTIN) 300 MG capsule Take 300 mg by mouth 2 (two) times daily.   Yes Historical Provider, MD  Hyprom-Naphaz-Polysorb-Zn Sulf (CLEAR EYES COMPLETE OP) Apply 1 drop to eye daily.   Yes Historical Provider, MD  ketorolac (TORADOL) 10 MG tablet Take  10 mg by mouth every 6 (six) hours as needed (pain).   Yes Historical Provider, MD  lamoTRIgine (LAMICTAL) 150 MG tablet Take 150 mg by mouth daily.   Yes Historical Provider, MD  omeprazole (PRILOSEC) 40 MG capsule Take 40 mg by mouth daily.   Yes Historical Provider, MD  prazosin (MINIPRESS) 1 MG capsule Take 1 mg by mouth at bedtime.   Yes Historical Provider, MD  QUEtiapine (SEROQUEL) 100 MG tablet Take 100 mg by mouth at bedtime.   Yes Historical Provider, MD  topiramate (TOPAMAX) 100 MG tablet Take 100 mg by mouth at bedtime.   Yes Historical Provider, MD   No Known Allergies  FAMILY HISTORY:  has no family status information on file.  SOCIAL HISTORY:  reports that she has been smoking Cigarettes.  She has been smoking about 0.00 packs per day. She does not have any smokeless tobacco history on file. She reports that she drinks alcohol. She reports that she does not use illicit drugs.  REVIEW OF SYSTEMS:  As per HPI - All other systems reviewed and were neg.    SUBJECTIVE:   VITAL SIGNS: Temp:  [98.2 F (36.8 C)-99 F (37.2 C)] 99 F (37.2 C) (11/05 1715) Pulse Rate:  [79-117] 105 (11/05 1715) Resp:  [18-38] 38 (11/05 1715) BP: (94-132)/(55-93) 103/80 mmHg (11/05 1715) SpO2:  [100 %] 100 % (11/05 1715) FiO2 (%):  [100 %]  100 % (11/05 1510) Weight:  [160 lb (72.576 kg)] 160 lb (72.576 kg) (11/05 1544) HEMODYNAMICS:   VENTILATOR SETTINGS: Vent Mode:  [-] PRVC FiO2 (%):  [100 %] 100 % Set Rate:  [16 bmp] 16 bmp Vt Set:  [460 mL] 460 mL PEEP:  [5 cmH20] 5 cmH20 Plateau Pressure:  [17 cmH20] 17 cmH20 INTAKE / OUTPUT: No intake or output data in the 24 hours ending 11/25/13 1722  PHYSICAL EXAMINATION: General:  Young female, NAD on vent  Neuro:  Sedated post intubation, coughs, moves L arm spontaneously, does not follow commands, pupils 5mm, brisk  HEENT:  Mm moist, no JVD, ETT  Cardiovascular:  s1s2 rrr  Lungs:  resps even, non labored on vent, cta  Abdomen:  abd  soft, +bs Musculoskeletal:  Warm and dry, no edema   LABS:  CBC  Recent Labs Lab 11/25/13 1518  HGB 15.3*  HCT 45.0   Coag's No results for input(s): APTT, INR in the last 168 hours. BMET  Recent Labs Lab 11/25/13 1518  NA 140  K 2.9*  CL 107  BUN 4*  CREATININE 0.60  GLUCOSE 107*   Electrolytes No results for input(s): CALCIUM, MG, PHOS in the last 168 hours. Sepsis Markers  Recent Labs Lab 11/25/13 1648  LATICACIDVEN 1.05   ABG  Recent Labs Lab 11/25/13 1653  PHART 7.327*  PCO2ART 46.2*  PO2ART 491.0*   Liver Enzymes No results for input(s): AST, ALT, ALKPHOS, BILITOT, ALBUMIN in the last 168 hours. Cardiac Enzymes  Recent Labs Lab 11/25/13 1500  TROPONINI <0.30   Glucose No results for input(s): GLUCAP in the last 168 hours.  Imaging No results found.   ASSESSMENT / PLAN:  PULMONARY OETT 11/5>>> Acute respiratory failure r/t AMS  P:   Vent support  Titrate FiO2   F/u CXR  Daily SBT    CARDIOVASCULAR Tachycardia  Hx HTN  P:  Monitor   RENAL Hypokalemia  P:   Replete K  F/u chem   GASTROINTESTINAL No active issue  P:   PPI   HEMATOLOGIC No active issue  P:  F/u cbc   INFECTIOUS No active issue  P:   Monitor wbc, fever curve off abx   ENDOCRINE No active issue    P:   Monitor glucose on chem   NEUROLOGIC ?seizure  AMS - unclear etiology.  Suspect r/t seizure/ post ictal but was essentially comatose for prolonged period.   Update - now waking up, following commands.  Neuro wants MRI.  Would leave intubated for MRI (propofol) and wean/extubate soon as able.   CT head neg  Bipolar disorder  P:   Neuro following  EEG  keppra  MRI pending  Hold home psych rx for now  Propofol  Would leave intubated for MRI, then wean and extubate quickly as able    FAMILY  - Updates:  Wife and sister updated at bedside 11/5     Dirk DressKaty Whiteheart, NP 11/25/2013  5:22 PM Pager: [phone] or [phone])  [phone]   Billy Fischeravid Jack Bolio, MD ; [facility] Mobile [phone].  After 5:30 PM or weekends, call [phone]

## 2013-11-25 NOTE — ED Notes (Signed)
NOTIFIED DR. ZOVKOWSKI IN PERSON FOR PATIENTS PANIC LAB RESULTS OF I-STAT CHEM8+@15 :27 PM ,12/02/03/2015.
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
## 2013-11-25 NOTE — Progress Notes (Signed)
Stat EEG completed, results pending  

## 2013-11-25 NOTE — Plan of Care (Signed)
Problem: Consults Goal: Ventilated Patients Patient Education See Patient Education Module for education specifics. Outcome: Completed/Met Date Met:  11/25/13  Problem: Phase I Progression Outcomes Goal: VTE prophylaxis Outcome: Completed/Met Date Met:  11/25/13 Goal: HOB elevated 30 degrees Outcome: Completed/Met Date Met:  11/25/13 Goal: VAP prevention protocol initiated Outcome: Completed/Met Date Met:  11/25/13 Goal: Sedation Protocol initiated if indicated Outcome: Completed/Met Date Met:  11/25/13 Goal: Pneumonia/flu vaccination screen completed Outcome: Completed/Met Date Met:  11/25/13
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
## 2013-11-26 DIAGNOSIS — T1490 Injury, unspecified: Secondary | ICD-10-CM

## 2013-11-26 DIAGNOSIS — J9601 Acute respiratory failure with hypoxia: Secondary | ICD-10-CM

## 2013-11-26 DIAGNOSIS — R569 Unspecified convulsions: Secondary | ICD-10-CM | POA: Insufficient documentation

## 2013-11-26 DIAGNOSIS — R55 Syncope and collapse: Secondary | ICD-10-CM

## 2013-11-26 DIAGNOSIS — T1490XA Injury, unspecified, initial encounter: Secondary | ICD-10-CM | POA: Insufficient documentation

## 2013-11-26 LAB — PREPARE FRESH FROZEN PLASMA
UNIT DIVISION: 0
Unit division: 0

## 2013-11-26 MED ORDER — LAMOTRIGINE 150 MG PO TABS
150.0000 mg | ORAL_TABLET | Freq: Two times a day (BID) | ORAL | Status: DC
Start: 2013-11-26 — End: 2013-11-26
  Filled 2013-11-26 (×2): qty 1

## 2013-11-26 MED ORDER — GABAPENTIN 300 MG PO CAPS
300.0000 mg | ORAL_CAPSULE | Freq: Two times a day (BID) | ORAL | Status: DC
  Filled 2013-11-26 (×2): qty 1

## 2013-11-26 MED ORDER — POTASSIUM CHLORIDE CRYS ER 20 MEQ PO TBCR
40.0000 meq | EXTENDED_RELEASE_TABLET | Freq: Once | ORAL | Status: AC
  Administered 2013-11-26: 40 meq via ORAL
  Filled 2013-11-26: qty 2

## 2013-11-26 MED ORDER — FLUOXETINE HCL 20 MG PO CAPS
20.0000 mg | ORAL_CAPSULE | Freq: Every day | ORAL | Status: DC
  Filled 2013-11-26: qty 1

## 2013-11-26 MED ORDER — AMOXICILLIN-POT CLAVULANATE 875-125 MG PO TABS: 1.0000 | ORAL_TABLET | Freq: Two times a day (BID) | ORAL | Status: DC

## 2013-11-26 MED ORDER — QUETIAPINE FUMARATE 100 MG PO TABS
100.0000 mg | ORAL_TABLET | Freq: Every day | ORAL | Status: DC
  Filled 2013-11-26: qty 1

## 2013-11-26 MED ORDER — ONDANSETRON HCL 4 MG/2ML IJ SOLN
INTRAMUSCULAR | Status: AC
  Filled 2013-11-26: qty 2

## 2013-11-26 MED ORDER — PRAZOSIN HCL 1 MG PO CAPS
1.0000 mg | ORAL_CAPSULE | Freq: Every day | ORAL | Status: DC
  Filled 2013-11-26: qty 1

## 2013-11-26 MED ORDER — AMOXICILLIN-POT CLAVULANATE 875-125 MG PO TABS
1.0000 | ORAL_TABLET | Freq: Two times a day (BID) | ORAL | Status: DC
  Filled 2013-11-26: qty 1

## 2013-11-26 MED ORDER — TOPIRAMATE 100 MG PO TABS
100.0000 mg | ORAL_TABLET | Freq: Every day | ORAL | Status: DC
  Filled 2013-11-26: qty 1

## 2013-11-26 MED ORDER — ONDANSETRON HCL 4 MG/2ML IJ SOLN
4.0000 mg | Freq: Four times a day (QID) | INTRAMUSCULAR | Status: DC | PRN
  Administered 2013-11-26: 4 mg via INTRAVENOUS

## 2013-11-26 NOTE — Progress Notes (Signed)
PULMONARY / CRITICAL CARE MEDICINE   Name: Nancy Mcpherson MRN: [id number] DOB: 1975/06/12    ADMISSION DATE:  11/25/2013  REFERRING MD :  EDP  CHIEF COMPLAINT:  AMS  INITIAL PRESENTATION: 38yo female with hx bipolar, depression, PTSD found down by partner outside.  GCS 3 on arrival to ER.  Intubated, PCCM called to admit.   STUDIES:  CT head 11/5>>>neg  CT chest 11/5>>> neg acute.  6x89mm LLL nodule likely inflammatory  CT abd/pelvis 11/5>>> neg acute   SIGNIFICANT EVENTS:   SUBJECTIVE:  No distress.   VITAL SIGNS: Temp:  [98.1 F (36.7 C)-99.7 F (37.6 C)] 99 F (37.2 C) (11/06 0817) Pulse Rate:  [59-117] 78 (11/06 0800) Resp:  [11-38] 19 (11/06 0800) BP: (89-132)/(46-104) 96/63 mmHg (11/06 0800) SpO2:  [91 %-100 %] 93 % (11/06 0800) FiO2 (%):  [45 %-100 %] 45 % (11/05 2227) Weight:  [72.576 kg (160 lb)-75.1 kg (165 lb 9.1 oz)] 75.1 kg (165 lb 9.1 oz) (11/06 0500)   Room air   INTAKE / OUTPUT:  Intake/Output Summary (Last 24 hours) at 11/26/13 0934 Last data filed at 11/26/13 0800  Gross per 24 hour  Intake 3224.93 ml  Output   1060 ml  Net 2164.93 ml    PHYSICAL EXAMINATION: General:  Young female, NAD  Neuro:  No neck pain, good mobility  HEENT: [place], no JVD   Cardiovascular:  s1s2 rrr  Lungs:  resps even, none labored  Abdomen:  abd soft, +bs Musculoskeletal:  Warm and dry, no edema   LABS:  CBC  Recent Labs Lab 11/25/13 1518  HGB 15.3*  HCT 45.0   Coag's No results for input(s): APTT, INR in the last 168 hours. BMET  Recent Labs Lab 11/25/13 1518  NA 140  K 2.9*  CL 107  BUN 4*  CREATININE 0.60  GLUCOSE 107*    Imaging Ct Head Wo Contrast  11/25/2013   CLINICAL DATA:  Found down, soft tissue swelling/ hematoma to right forehead  EXAM: CT HEAD WITHOUT CONTRAST  CT CERVICAL SPINE WITHOUT CONTRAST  TECHNIQUE: Multidetector CT imaging of the head and cervical spine was performed following the standard protocol without intravenous  contrast. Multiplanar CT image reconstructions of the cervical spine were also generated.  COMPARISON:  None.  FINDINGS: CT HEAD FINDINGS  No evidence of parenchymal hemorrhage or extra-axial fluid collection. No mass lesion, mass effect, or midline shift.  No CT evidence of acute infarction.  Cerebral volume is within normal limits.  No ventriculomegaly.  Partial opacification/ mucous retention cyst in the right maxillary sinus. The visualized paranasal sinuses are otherwise essentially clear. The mastoid air cells are unopacified.  No evidence of calvarial fracture.  CT CERVICAL SPINE FINDINGS  Straightening of the cervical spine.  No evidence of fracture or dislocation. Vertebral body heights are maintained. Dens appears intact.  No prevertebral soft tissue swelling.  Mild degenerative changes at C5-6.  Visualized thyroid is unremarkable.  Visualized lung apices are clear.  IMPRESSION: Normal head CT.  Normal cervical spine CT.   Electronically Signed   By: Charline Bills M.D.   On: 11/25/2013 16:10   Ct Chest W Contrast  11/25/2013   CLINICAL DATA:  Head trauma. Hematoma to the forehead. The patient was found down.  EXAM: CT CHEST AND ABDOMEN WITH CONTRAST  TECHNIQUE: Multidetector CT imaging of the chest and abdomen was performed following the standard protocol during bolus administration of intravenous contrast.  CONTRAST:  80mL OMNIPAQUE IOHEXOL 300 MG/ML  SOLN  COMPARISON:  None.  FINDINGS: CT CHEST FINDINGS  There is slight dependent atelectasis at the lung bases. There is a 6 x 9 mm nodular density at the left lung base laterally best seen on image 44 of series 202. This is probably inflammatory in origin but I recommend a follow-up CT in 6 months without contrast to determine ongoing stability. Lungs are otherwise clear.  Heart and other mediastinal structures are normal. Endotracheal tube and NG tube are in good position. Osseous structures are normal. No pneumothorax.  CT ABDOMEN FINDINGS  Liver,  spleen, pancreas, adrenal glands, and kidneys are normal. Gallbladder has been removed. Biliary tree is normal. NG tube tip is in the antrum of the stomach. There is no free air or free fluid in the abdomen. Bowel appears normal. The appendix is not visible. Uterus has been removed. Ovaries are normal. Bladder is normal. No significant acute osseous abnormality. Degenerative disc disease at L5-S1.  IMPRESSION: 1. Minimal dependent atelectasis at the lung bases. 6 x 9 mm nodule at the left lung base is most likely inflammatory but I recommend a six-month CT scan follow-up without contrast. 2. Benign appearing abdomen.   Electronically Signed   By: Geanie CooleyJim  Maxwell M.D.   On: 11/25/2013 16:25   Ct Cervical Spine Wo Contrast  11/25/2013   CLINICAL DATA:  Found down, soft tissue swelling/ hematoma to right forehead  EXAM: CT HEAD WITHOUT CONTRAST  CT CERVICAL SPINE WITHOUT CONTRAST  TECHNIQUE: Multidetector CT imaging of the head and cervical spine was performed following the standard protocol without intravenous contrast. Multiplanar CT image reconstructions of the cervical spine were also generated.  COMPARISON:  None.  FINDINGS: CT HEAD FINDINGS  No evidence of parenchymal hemorrhage or extra-axial fluid collection. No mass lesion, mass effect, or midline shift.  No CT evidence of acute infarction.  Cerebral volume is within normal limits.  No ventriculomegaly.  Partial opacification/ mucous retention cyst in the right maxillary sinus. The visualized paranasal sinuses are otherwise essentially clear. The mastoid air cells are unopacified.  No evidence of calvarial fracture.  CT CERVICAL SPINE FINDINGS  Straightening of the cervical spine.  No evidence of fracture or dislocation. Vertebral body heights are maintained. Dens appears intact.  No prevertebral soft tissue swelling.  Mild degenerative changes at C5-6.  Visualized thyroid is unremarkable.  Visualized lung apices are clear.  IMPRESSION: Normal head CT.  Normal  cervical spine CT.   Electronically Signed   By: Charline BillsSriyesh  Krishnan M.D.   On: 11/25/2013 16:10   Mr Laqueta JeanBrain W ZOWo Contrast  11/26/2013   CLINICAL DATA:  Initial evaluation for seizure. Found down and unresponsive.  EXAM: MRI HEAD WITHOUT AND WITH CONTRAST  TECHNIQUE: Multiplanar, multiecho pulse sequences of the brain and surrounding structures were obtained without and with intravenous contrast.  CONTRAST:  15mL MULTIHANCE GADOBENATE DIMEGLUMINE 529 MG/ML IV SOLN  COMPARISON:  Prior CT from earlier the same day.  FINDINGS: The CSF containing spaces are within normal limits for patient age. No focal parenchymal signal abnormality is identified. No mass lesion, midline shift, or extra-axial fluid collection. Ventricles are normal in size without evidence of hydrocephalus. Hippocampi are symmetric and size in appearance without signal abnormality.  No diffusion-weighted signal abnormality is identified to suggest acute intracranial infarct. Gray-white matter differentiation is maintained. Normal flow voids are seen within the intracranial vasculature. No intracranial hemorrhage identified.  The cervicomedullary junction is normal. Pituitary gland is within normal limits. Pituitary stalk is midline. The globes  and optic nerves demonstrate a normal appearance with normal signal intensity.  No abnormal enhancement on post-contrast sequences.  The bone marrow signal intensity is normal. Calvarium is intact. Visualized upper cervical spine is within normal limits.  Scalp soft tissues are unremarkable.  Moderate fluid signal intensity present within the right maxillary sinus. Scattered mucoperiosteal thickening present within the ethmoidal air cells as well as the sphenoid sinuses. Changes are likely impart related to intubation. No mastoid effusion  IMPRESSION: Normal brain MRI with no acute intracranial abnormality identified.   Electronically Signed   By: Rise Mu M.D.   On: 11/26/2013 00:31   Ct Abdomen  Pelvis W Contrast  11/25/2013   CLINICAL DATA:  Head trauma. Hematoma to the forehead. The patient was found down.  EXAM: CT CHEST AND ABDOMEN WITH CONTRAST  TECHNIQUE: Multidetector CT imaging of the chest and abdomen was performed following the standard protocol during bolus administration of intravenous contrast.  CONTRAST:  80mL OMNIPAQUE IOHEXOL 300 MG/ML  SOLN  COMPARISON:  None.  FINDINGS: CT CHEST FINDINGS  There is slight dependent atelectasis at the lung bases. There is a 6 x 9 mm nodular density at the left lung base laterally best seen on image 44 of series 202. This is probably inflammatory in origin but I recommend a follow-up CT in 6 months without contrast to determine ongoing stability. Lungs are otherwise clear.  Heart and other mediastinal structures are normal. Endotracheal tube and NG tube are in good position. Osseous structures are normal. No pneumothorax.  CT ABDOMEN FINDINGS  Liver, spleen, pancreas, adrenal glands, and kidneys are normal. Gallbladder has been removed. Biliary tree is normal. NG tube tip is in the antrum of the stomach. There is no free air or free fluid in the abdomen. Bowel appears normal. The appendix is not visible. Uterus has been removed. Ovaries are normal. Bladder is normal. No significant acute osseous abnormality. Degenerative disc disease at L5-S1.  IMPRESSION: 1. Minimal dependent atelectasis at the lung bases. 6 x 9 mm nodule at the left lung base is most likely inflammatory but I recommend a six-month CT scan follow-up without contrast. 2. Benign appearing abdomen.   Electronically Signed   By: Geanie Cooley M.D.   On: 11/25/2013 16:25   Dg Pelvis Portable  11/25/2013   CLINICAL DATA:  Found unresponsive on the ground 1 hr ago.  EXAM: PORTABLE PELVIS 1-2 VIEWS  COMPARISON:  None.  FINDINGS: The upper portion the pelvis is not included. The included portions appear normal without fracture or dislocation.  IMPRESSION: Excluded upper pelvis with no abnormality  seen.   Electronically Signed   By: Gordan Payment M.D.   On: 11/25/2013 15:35   Dg Chest Portable 1 View  11/25/2013   CLINICAL DATA:  ET tube placement.  Found unresponsive.  EXAM: PORTABLE CHEST - 1 VIEW  COMPARISON:  None.  FINDINGS: The endotracheal tube tip just above the level of the carina. There is a nasogastric tube with tip in the stomach. Normal heart size. No pleural effusion or edema.  IMPRESSION: Satisfactory position of the ET tube with tip above the carina.   Electronically Signed   By: Signa Kell M.D.   On: 11/25/2013 15:49     ASSESSMENT / PLAN: AMS - unclear etiology.  Suspect r/t seizure/ post ictal but was essentially comatose for prolonged period.   Bipolar disorder  MRI nml EEG normal Plan:   Cleared for d/c  Resume psych meds  Acute sinusitis  Plan: 10d augmentin  Hx HTN  Plan:  Monitor   Hypokalemia  Plan:   Replete K    FAMILY  - Updates:  Wife and sister updated at bedside 11/6  Reviewed above, and examined.  She has sinus congestion.  Advised her to try OTC nasal irrigation and nasacort two sprays daily as needed.  Neuro eval unremarkable.  Okay for d/c home today.  Updated family.  Coralyn HellingVineet Sabreen Kitchen, MD [facility] 11/26/2013, 11:00 AM Pager:  [phone] After 3pm call: [phone]

## 2013-11-26 NOTE — Progress Notes (Signed)
Discharge instruction gone over with patient at bedside, no questions at this time. Discharged as per MD order to front of hospital via wheelchair by volunteer.

## 2013-11-26 NOTE — Discharge Summary (Signed)
Physician Discharge Summary       Patient ID: Nancy Mcpherson MRN: [id number] DOB/AGE: [birth year] 38 y.o.  Admit date: 11/25/2013 Discharge date: 11/26/2013  Discharge Diagnoses:  Acute encephalopathy (unclear etiology) Bipolar disease Acute sinusitis  Hx of HTN Detailed Hospital Course:   38 yo female with hx bipolar, depression, PTSD found down by partner outside. She was in her usual state of health, working outside on their new home. Her wife (who is an EMT) had not seen her for a little while and when she went to look for her, found her lying on her R side on the driveway with her flip flops thrown off to the sides. Per EMS eyes were deviated up and to the left on EMS arrival. No hx seizures. Per wife no recent increased depression, very compliant with medications. No recent c/o SOB, chest pain, headache, leg/calf pain, recent travel. Did have c/o back pain over last few days. Was intubated by EDP for airway protection, placed on diprivan gtt and admitted to the ICU. Neurology was consulted. EEG was done: this was normal, MRI brain was unremarkable, CT brain showed:  Partial opacification/ mucous retention cyst in the right maxillary sinus only. She was extubated. Observed in the ICU. Seen by neurology and cleared from neuro standpoint to be discharged to home.    Discharge Plan by active problems  Bipolar disease Plan Resume all home meds  Acute sinusitis  Plan  10d augmentin   HTN Plan Resume home rx  Left lung nodule Plan F/u CT chest 6 months.   Significant Hospital tests/ studies  Consults neuro   Discharge Exam: BP 96/63 mmHg  Pulse 78  Temp(Src) 99 F (37.2 C) (Core (Comment))  Resp 19  Ht 5\' 5"  (1.651 m)  Wt 75.1 kg (165 lb 9.1 oz)  BMI 27.55 kg/m2  SpO2 93%  LMP  (LMP Unknown)  General: Young female, NAD  Neuro: No neck pain, good mobility  HEENT: [place], no JVD  Cardiovascular: s1s2 rrr  Lungs: resps even, none labored  Abdomen: abd  soft, +bs Musculoskeletal: Warm and dry, no edema   Labs at discharge Lab Results  Component Value Date   CREATININE 0.60 11/25/2013   BUN 4* 11/25/2013   NA 140 11/25/2013   K 2.9* 11/25/2013   CL 107 11/25/2013   Lab Results  Component Value Date   HGB 15.3* 11/25/2013   HCT 45.0 11/25/2013   No results found for: ALT, AST, GGT, ALKPHOS, BILITOT No results found for: INR, PROTIME  Current radiology studies Ct Head Wo Contrast  11/25/2013   CLINICAL DATA:  Found down, soft tissue swelling/ hematoma to right forehead  EXAM: CT HEAD WITHOUT CONTRAST  CT CERVICAL SPINE WITHOUT CONTRAST  TECHNIQUE: Multidetector CT imaging of the head and cervical spine was performed following the standard protocol without intravenous contrast. Multiplanar CT image reconstructions of the cervical spine were also generated.  COMPARISON:  None.  FINDINGS: CT HEAD FINDINGS  No evidence of parenchymal hemorrhage or extra-axial fluid collection. No mass lesion, mass effect, or midline shift.  No CT evidence of acute infarction.  Cerebral volume is within normal limits.  No ventriculomegaly.  Partial opacification/ mucous retention cyst in the right maxillary sinus. The visualized paranasal sinuses are otherwise essentially clear. The mastoid air cells are unopacified.  No evidence of calvarial fracture.  CT CERVICAL SPINE FINDINGS  Straightening of the cervical spine.  No evidence of fracture or dislocation. Vertebral body heights are maintained. Dens appears intact.  No prevertebral soft tissue swelling.  Mild degenerative changes at C5-6.  Visualized thyroid is unremarkable.  Visualized lung apices are clear.  IMPRESSION: Normal head CT.  Normal cervical spine CT.   Electronically Signed   By: Charline Bills M.D.   On: 11/25/2013 16:10   Ct Chest W Contrast  11/25/2013   CLINICAL DATA:  Head trauma. Hematoma to the forehead. The patient was found down.  EXAM: CT CHEST AND ABDOMEN WITH CONTRAST  TECHNIQUE:  Multidetector CT imaging of the chest and abdomen was performed following the standard protocol during bolus administration of intravenous contrast.  CONTRAST:  80mL OMNIPAQUE IOHEXOL 300 MG/ML  SOLN  COMPARISON:  None.  FINDINGS: CT CHEST FINDINGS  There is slight dependent atelectasis at the lung bases. There is a 6 x 9 mm nodular density at the left lung base laterally best seen on image 44 of series 202. This is probably inflammatory in origin but I recommend a follow-up CT in 6 months without contrast to determine ongoing stability. Lungs are otherwise clear.  Heart and other mediastinal structures are normal. Endotracheal tube and NG tube are in good position. Osseous structures are normal. No pneumothorax.  CT ABDOMEN FINDINGS  Liver, spleen, pancreas, adrenal glands, and kidneys are normal. Gallbladder has been removed. Biliary tree is normal. NG tube tip is in the antrum of the stomach. There is no free air or free fluid in the abdomen. Bowel appears normal. The appendix is not visible. Uterus has been removed. Ovaries are normal. Bladder is normal. No significant acute osseous abnormality. Degenerative disc disease at L5-S1.  IMPRESSION: 1. Minimal dependent atelectasis at the lung bases. 6 x 9 mm nodule at the left lung base is most likely inflammatory but I recommend a six-month CT scan follow-up without contrast. 2. Benign appearing abdomen.   Electronically Signed   By: Geanie Cooley M.D.   On: 11/25/2013 16:25   Ct Cervical Spine Wo Contrast  11/25/2013   CLINICAL DATA:  Found down, soft tissue swelling/ hematoma to right forehead  EXAM: CT HEAD WITHOUT CONTRAST  CT CERVICAL SPINE WITHOUT CONTRAST  TECHNIQUE: Multidetector CT imaging of the head and cervical spine was performed following the standard protocol without intravenous contrast. Multiplanar CT image reconstructions of the cervical spine were also generated.  COMPARISON:  None.  FINDINGS: CT HEAD FINDINGS  No evidence of parenchymal  hemorrhage or extra-axial fluid collection. No mass lesion, mass effect, or midline shift.  No CT evidence of acute infarction.  Cerebral volume is within normal limits.  No ventriculomegaly.  Partial opacification/ mucous retention cyst in the right maxillary sinus. The visualized paranasal sinuses are otherwise essentially clear. The mastoid air cells are unopacified.  No evidence of calvarial fracture.  CT CERVICAL SPINE FINDINGS  Straightening of the cervical spine.  No evidence of fracture or dislocation. Vertebral body heights are maintained. Dens appears intact.  No prevertebral soft tissue swelling.  Mild degenerative changes at C5-6.  Visualized thyroid is unremarkable.  Visualized lung apices are clear.  IMPRESSION: Normal head CT.  Normal cervical spine CT.   Electronically Signed   By: Charline Bills M.D.   On: 11/25/2013 16:10   Mr Nancy Mcpherson ZO Contrast  11/26/2013   CLINICAL DATA:  Initial evaluation for seizure. Found down and unresponsive.  EXAM: MRI HEAD WITHOUT AND WITH CONTRAST  TECHNIQUE: Multiplanar, multiecho pulse sequences of the brain and surrounding structures were obtained without and with intravenous contrast.  CONTRAST:  15mL MULTIHANCE GADOBENATE  DIMEGLUMINE 529 MG/ML IV SOLN  COMPARISON:  Prior CT from earlier the same day.  FINDINGS: The CSF containing spaces are within normal limits for patient age. No focal parenchymal signal abnormality is identified. No mass lesion, midline shift, or extra-axial fluid collection. Ventricles are normal in size without evidence of hydrocephalus. Hippocampi are symmetric and size in appearance without signal abnormality.  No diffusion-weighted signal abnormality is identified to suggest acute intracranial infarct. Gray-white matter differentiation is maintained. Normal flow voids are seen within the intracranial vasculature. No intracranial hemorrhage identified.  The cervicomedullary junction is normal. Pituitary gland is within normal limits.  Pituitary stalk is midline. The globes and optic nerves demonstrate a normal appearance with normal signal intensity.  No abnormal enhancement on post-contrast sequences.  The bone marrow signal intensity is normal. Calvarium is intact. Visualized upper cervical spine is within normal limits.  Scalp soft tissues are unremarkable.  Moderate fluid signal intensity present within the right maxillary sinus. Scattered mucoperiosteal thickening present within the ethmoidal air cells as well as the sphenoid sinuses. Changes are likely impart related to intubation. No mastoid effusion  IMPRESSION: Normal brain MRI with no acute intracranial abnormality identified.   Electronically Signed   By: Rise Mu M.D.   On: 11/26/2013 00:31   Ct Abdomen Pelvis W Contrast  11/25/2013   CLINICAL DATA:  Head trauma. Hematoma to the forehead. The patient was found down.  EXAM: CT CHEST AND ABDOMEN WITH CONTRAST  TECHNIQUE: Multidetector CT imaging of the chest and abdomen was performed following the standard protocol during bolus administration of intravenous contrast.  CONTRAST:  80mL OMNIPAQUE IOHEXOL 300 MG/ML  SOLN  COMPARISON:  None.  FINDINGS: CT CHEST FINDINGS  There is slight dependent atelectasis at the lung bases. There is a 6 x 9 mm nodular density at the left lung base laterally best seen on image 44 of series 202. This is probably inflammatory in origin but I recommend a follow-up CT in 6 months without contrast to determine ongoing stability. Lungs are otherwise clear.  Heart and other mediastinal structures are normal. Endotracheal tube and NG tube are in good position. Osseous structures are normal. No pneumothorax.  CT ABDOMEN FINDINGS  Liver, spleen, pancreas, adrenal glands, and kidneys are normal. Gallbladder has been removed. Biliary tree is normal. NG tube tip is in the antrum of the stomach. There is no free air or free fluid in the abdomen. Bowel appears normal. The appendix is not visible. Uterus  has been removed. Ovaries are normal. Bladder is normal. No significant acute osseous abnormality. Degenerative disc disease at L5-S1.  IMPRESSION: 1. Minimal dependent atelectasis at the lung bases. 6 x 9 mm nodule at the left lung base is most likely inflammatory but I recommend a six-month CT scan follow-up without contrast. 2. Benign appearing abdomen.   Electronically Signed   By: Geanie Cooley M.D.   On: 11/25/2013 16:25   Dg Pelvis Portable  11/25/2013   CLINICAL DATA:  Found unresponsive on the ground 1 hr ago.  EXAM: PORTABLE PELVIS 1-2 VIEWS  COMPARISON:  None.  FINDINGS: The upper portion the pelvis is not included. The included portions appear normal without fracture or dislocation.  IMPRESSION: Excluded upper pelvis with no abnormality seen.   Electronically Signed   By: Gordan Payment M.D.   On: 11/25/2013 15:35   Dg Chest Portable 1 View  11/25/2013   CLINICAL DATA:  ET tube placement.  Found unresponsive.  EXAM: PORTABLE CHEST - 1 VIEW  COMPARISON:  None.  FINDINGS: The endotracheal tube tip just above the level of the carina. There is a nasogastric tube with tip in the stomach. Normal heart size. No pleural effusion or edema.  IMPRESSION: Satisfactory position of the ET tube with tip above the carina.   Electronically Signed   By: Signa Kellaylor  Stroud M.D.   On: 11/25/2013 15:49    Disposition:  Final discharge disposition not confirmed      Discharge Instructions    Diet - low sodium heart healthy    Complete by:  As directed      Discharge instructions    Complete by:  As directed   You need f/u CT chest 6 months re: left lung nodule.     Increase activity slowly    Complete by:  As directed             Medication List    STOP taking these medications        ketorolac 10 MG tablet  Commonly known as:  TORADOL      TAKE these medications        amoxicillin-clavulanate 875-125 MG per tablet  Commonly known as:  AUGMENTIN  Take 1 tablet by mouth every 12 (twelve) hours.      CLEAR EYES COMPLETE OP  Apply 1 drop to eye daily.     etodolac 500 MG tablet  Commonly known as:  LODINE  Take 500 mg by mouth 2 (two) times daily.     FLUoxetine 20 MG capsule  Commonly known as:  PROZAC  Take 60 mg by mouth every morning.     gabapentin 300 MG capsule  Commonly known as:  NEURONTIN  Take 300 mg by mouth 2 (two) times daily.     lamoTRIgine 150 MG tablet  Commonly known as:  LAMICTAL  Take 150 mg by mouth daily.     omeprazole 40 MG capsule  Commonly known as:  PRILOSEC  Take 40 mg by mouth daily.     prazosin 1 MG capsule  Commonly known as:  MINIPRESS  Take 1 mg by mouth at bedtime.     QUEtiapine 100 MG tablet  Commonly known as:  SEROQUEL  Take 100 mg by mouth at bedtime.     topiramate 100 MG tablet  Commonly known as:  TOPAMAX  Take 100 mg by mouth at bedtime.       Follow-up Information    Follow up with COUSINS, MELISSA A, FNP In 10 days.   Specialty:  Nurse Practitioner   Contact information:   [address] [place] [address] [phone]       Discharged Condition: good  Physician Statement:   The Patient was personally examined, the discharge assessment and plan has been personally reviewed and I agree with ACNP Babcock's assessment and plan. > 30 minutes of time have been dedicated to discharge assessment, planning and discharge instructions.   SignedAnders Simmonds: BABCOCK,PETE 11/26/2013, 10:14 AM  Coralyn HellingVineet Marke Goodwyn, MD [facility] 11/26/2013, 11:01 AM Pager:  [phone] After 3pm call: [phone]

## 2013-11-26 NOTE — Procedures (Signed)
Extubation Procedure Note  Patient Details:   Name: Nancy Mcpherson DOB: Feb 13, 1975 MRN: [id number]   Pt extubated following PS 5cm H20 per MD order. Cuff leak present. Pt following all commands. No stridor. Pt comfortable. RT will monitor   Evaluation  O2 sats: stable throughout Complications: No apparent complications Patient did tolerate procedure well. Bilateral Breath Sounds: Clear, Diminished Suctioning: Airway Yes  Nancy Mcpherson, Nancy Mcpherson 11/26/2013, 12:08 AM

## 2013-11-26 NOTE — Procedures (Signed)
ELECTROENCEPHALOGRAM REPORT  Patient: Nicholaus Corollalicia Marsala       Room #: 1O103M07 EEG No. ID: [id number] Age: 38 y.o.        Sex: female Referring Physician: Sung AmabileSIMONDS, D Report Date:  11/26/2013        Interpreting Physician: Aline BrochureSTEWART,Davarious Tumbleson R  History: Nicholaus Corollalicia Zent is an 38 y.o. female with a history of bipolar affective disorder, PTSD, and migraine headaches who was brought to the emergency room after being found unconscious lying on the street in front of her house. She had a superficial abrasion to her forehead but no other signs of trauma. Eyes were noted to be deviated to the left. No tonic or clonic activity was reported. She was intubated for airway protection.  Indications for study:  Rule out status epilepticus  Technique: This is an 18 channel routine scalp EEG performed at the bedside with bipolar and monopolar montages arranged in accordance to the international 10/20 system of electrode placement.   Description: This EEG recording was performed during wakefulness, as patient had regained consciousness by the time the EEG recording was started, and was interacting appropriately. The predominant background activity consisted of 10 Hz symmetrical alpha rhythm which attenuated with eye opening and activity. Photic stimulation was not performed. There was considerable muscle artifact towards the end of the EEG recording. No epileptiform discharges were recorded. There was no abnormal slowing of cerebral activity.  Interpretation: This is a normal EEG recording during wakefulness. There was no evidence of seizure activity, nor any indication of postictal slowing on regaining consciousness.   Venetia MaxonR Essance Gatti M.D. [facility] [phone]

## 2013-11-26 NOTE — Progress Notes (Signed)
Pt refused morning medication, will take when she gets home.  Patient planned discharge this morning.

## 2013-11-26 NOTE — Progress Notes (Signed)
eLink Physician-Brief Progress Note Patient Name: Nancy Mcpherson DOB: [birth year] MRN: [id number]   Date of Service  11/26/2013  HPI/Events of Note  nausea  eICU Interventions  zofran     Intervention Category Minor Interventions: Routine modifications to care plan (e.g. PRN medications for pain, fever)  Lealon Vanputten 11/26/2013, 5:58 AM

## 2013-11-26 NOTE — Progress Notes (Signed)
UR completed.  Sara Selvidge, RN BSN [facility] CCM Trauma/Neuro ICU Case Manager [phone]  

## 2013-11-29 LAB — BLOOD PRODUCT ORDER (VERBAL) VERIFICATION

## 2013-12-03 ENCOUNTER — Encounter (HOSPITAL_COMMUNITY): Payer: Self-pay | Admitting: Emergency Medicine

## 2014-11-05 IMAGING — CT CT HEAD W/O CM
3 of 5 series · 14 of 47 positions shown, 16 images · non-contrast
Comparison: None.

CLINICAL DATA: Found down, soft tissue swelling/ hematoma to right
forehead

EXAM:
CT HEAD WITHOUT CONTRAST
CT CERVICAL SPINE WITHOUT CONTRAST
TECHNIQUE: Multidetector CT imaging of the head and cervical spine was
performed following the standard protocol without intravenous
contrast. Multiplanar CT image reconstructions of the cervical spine
were also generated.

[Series 302: soft tissue, idose (2) · axial · 0.30mm/px · z∈[-7,+175]mm · 8 of 107 slices shown, 10 images]
[im 8/107  brain]
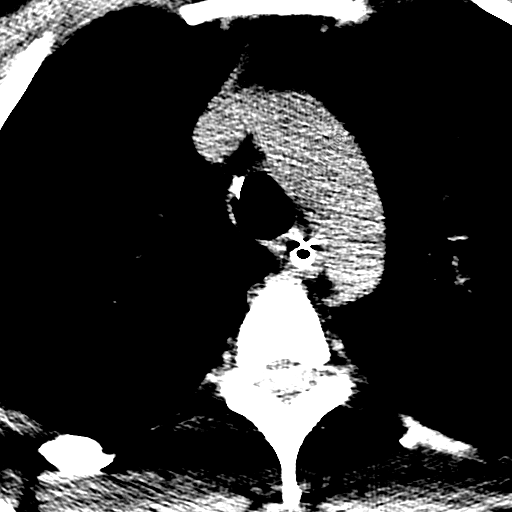
[im 8/107  bone]
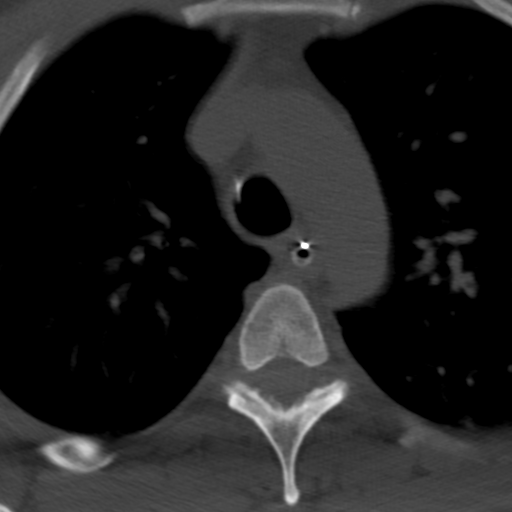
[im 22/107  brain]
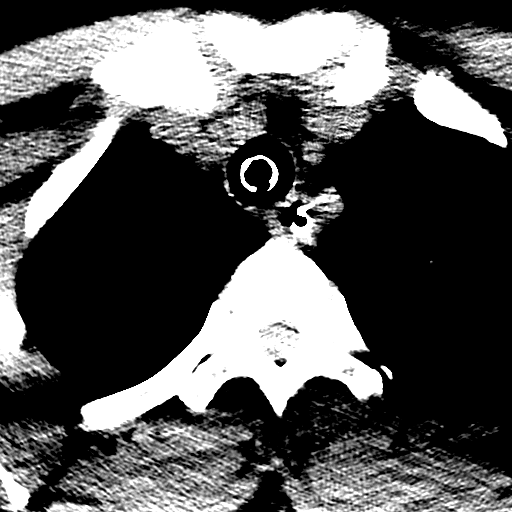
[im 36/107  brain]
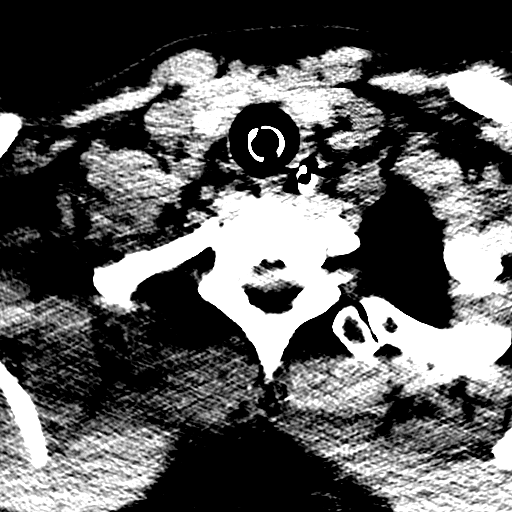
[im 50/107  brain]
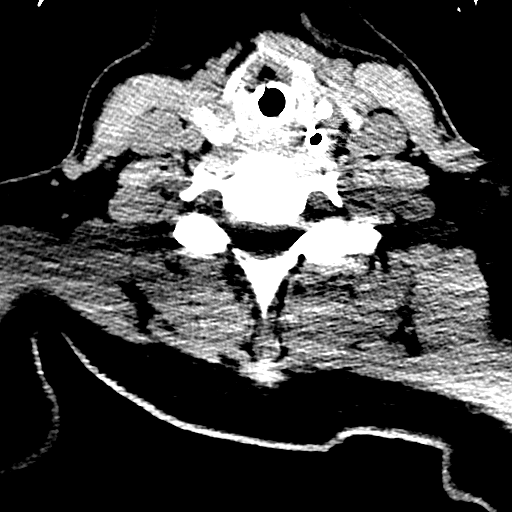
[im 57/107  brain]
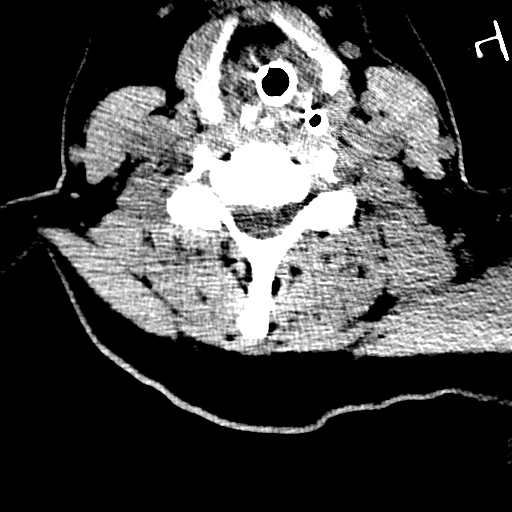
[im 57/107  bone]
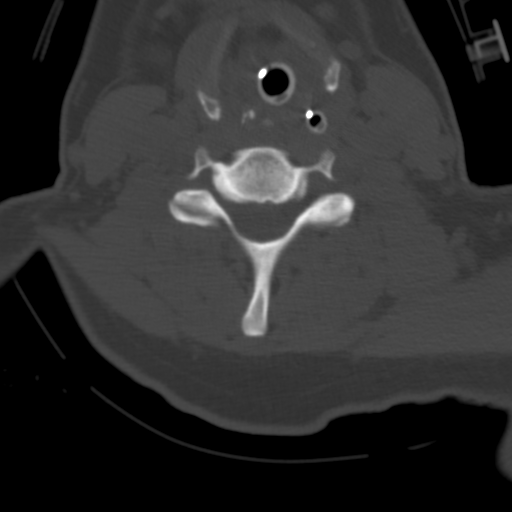
[im 71/107  brain]
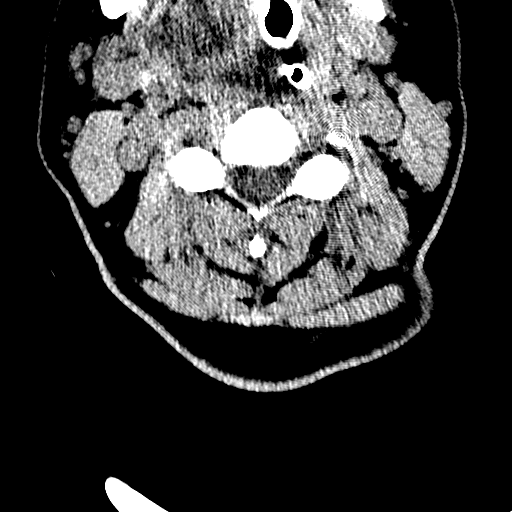
[im 85/107  brain]
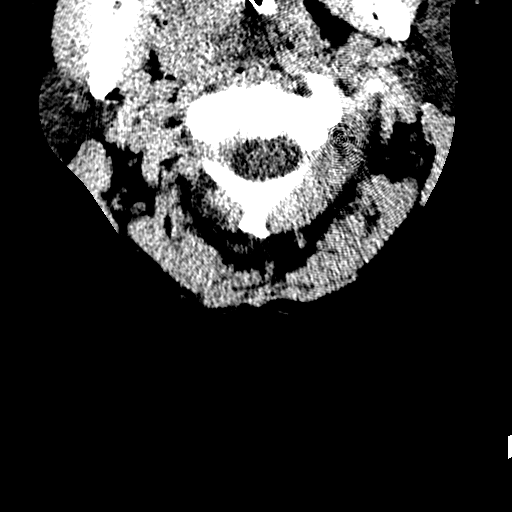
[im 99/107  brain]
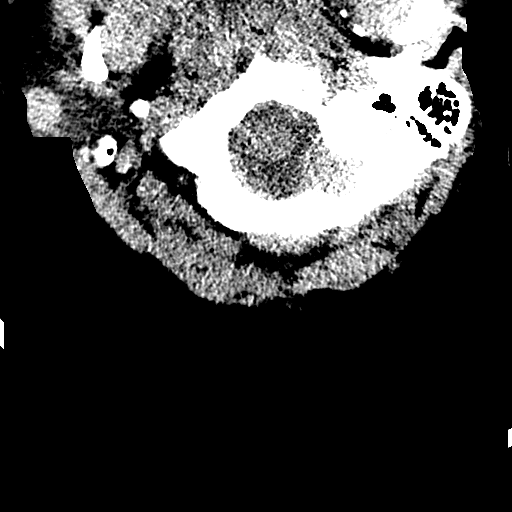

[Series 303: sagittal, idose (2) · sagittal · 0.30mm/px · 3 of 68 slices shown]
[im 23/68  brain]
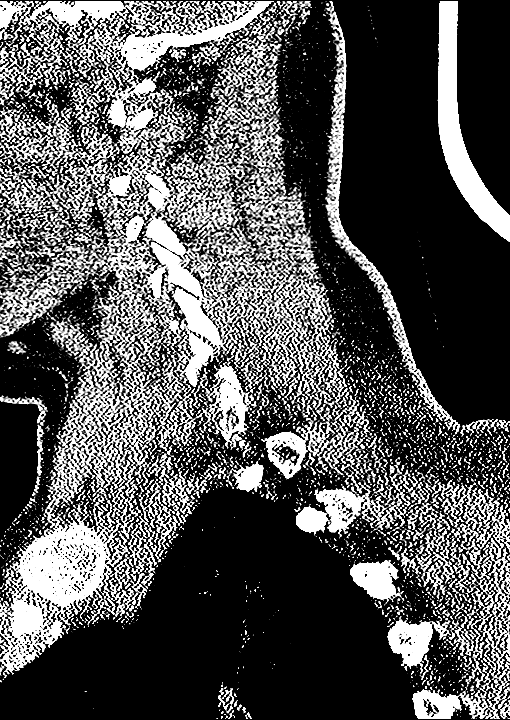
[im 34/68  brain]
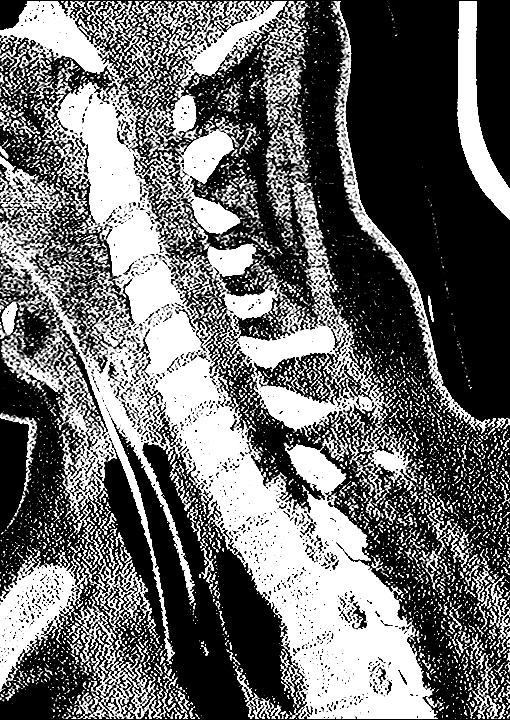
[im 45/68  brain]
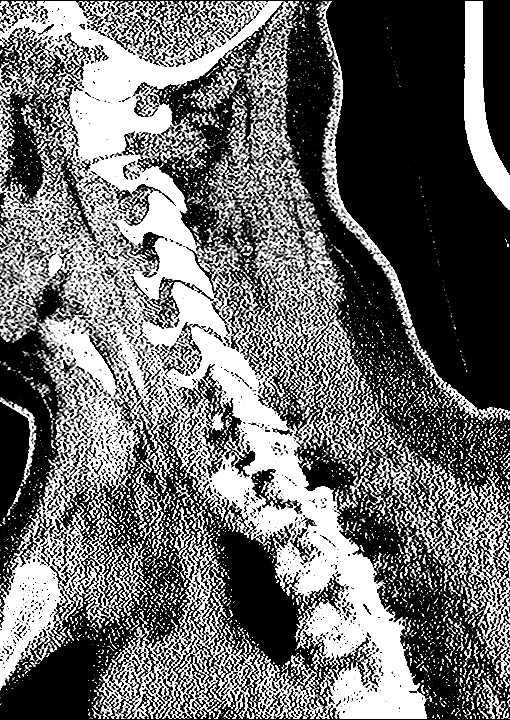

[Series 304: coronal, idose (2) · coronal · 0.30mm/px · 3 of 68 slices shown]
[im 23/68  brain]
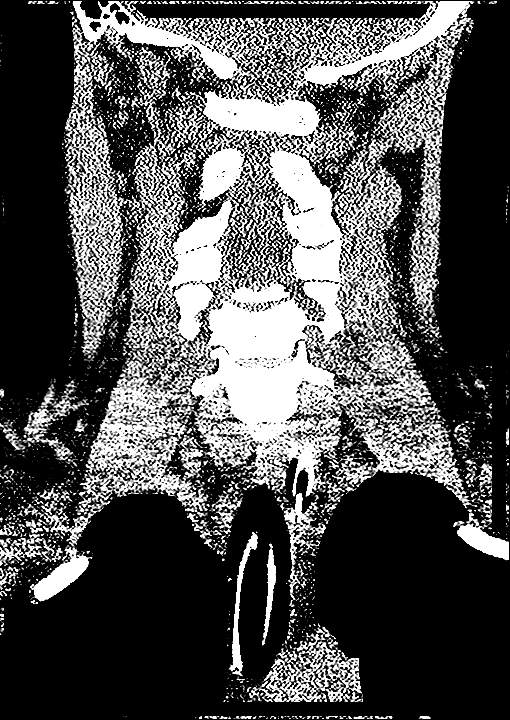
[im 30/68  brain]
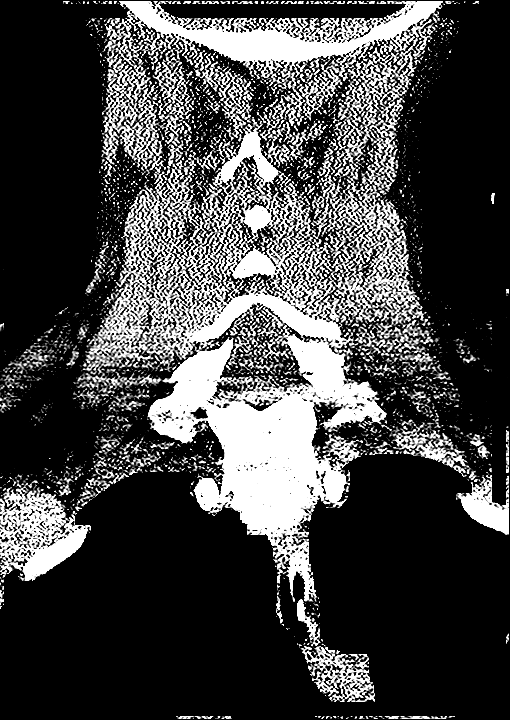
[im 38/68  brain]
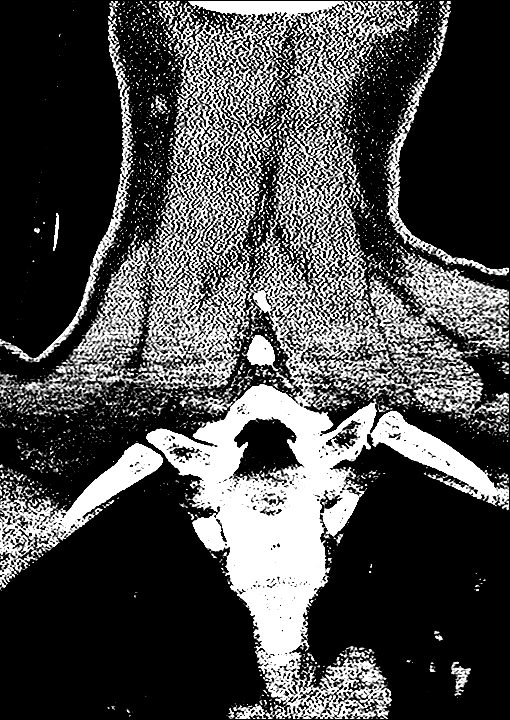

[14 of 47 positions shown; findings below may reference images not displayed]

FINDINGS: CT HEAD FINDINGS

No evidence of parenchymal hemorrhage or extra-axial fluid
collection. No mass lesion, mass effect, or midline shift.

No CT evidence of acute infarction.

Cerebral volume is within normal limits.  No ventriculomegaly.

Partial opacification/ mucous retention cyst in the right maxillary
sinus. The visualized paranasal sinuses are otherwise essentially
clear. The mastoid air cells are unopacified.

No evidence of calvarial fracture.

CT CERVICAL SPINE FINDINGS

Straightening of the cervical spine.

No evidence of fracture or dislocation. Vertebral body heights are
maintained. Dens appears intact.

No prevertebral soft tissue swelling.

Mild degenerative changes at C5-6.

Visualized thyroid is unremarkable.

Visualized lung apices are clear.
IMPRESSION: Normal head CT.

Normal cervical spine CT.

## 2014-11-05 IMAGING — CR DG PORTABLE PELVIS
1 series · 1 of 1 positions shown · non-contrast
Comparison: None.

CLINICAL DATA: Found unresponsive on the ground 1 hr ago.

EXAM:
PORTABLE PELVIS 1-2 VIEWS

[ap]
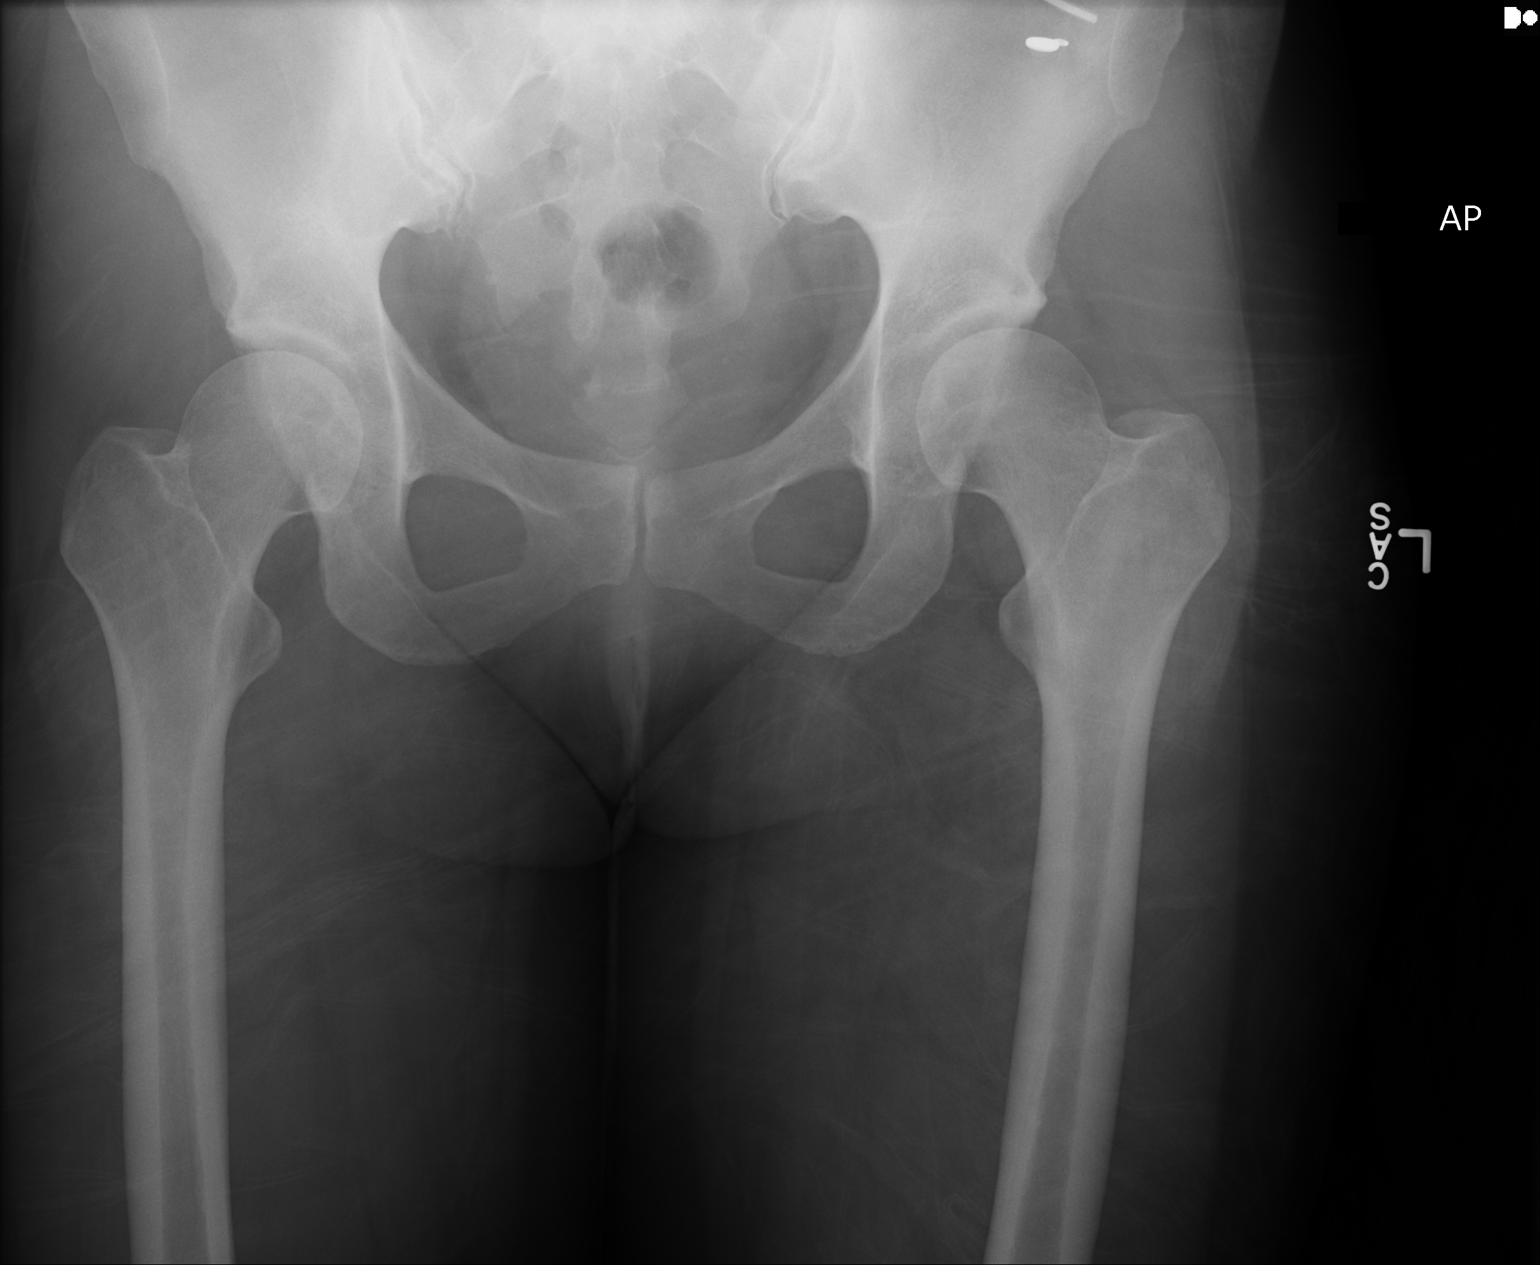

[1 of 1 positions shown; findings below may reference images not displayed]

FINDINGS: The upper portion the pelvis is not included. The included portions
appear normal without fracture or dislocation.
IMPRESSION: Excluded upper pelvis with no abnormality seen.

## 2020-05-03 DIAGNOSIS — Z01818 Encounter for other preprocedural examination: Secondary | ICD-10-CM | POA: Diagnosis not present

## 2020-05-08 ENCOUNTER — Telehealth: Payer: Self-pay

## 2020-05-08 NOTE — Telephone Encounter (Signed)
A referral was sent for this pt from [facility],  [place] - phone no. [phone].  A copy of referral was sent to scheduling.

## 2020-07-31 DIAGNOSIS — M5126 Other intervertebral disc displacement, lumbar region: Secondary | ICD-10-CM | POA: Insufficient documentation

## 2020-07-31 DIAGNOSIS — M5430 Sciatica, unspecified side: Secondary | ICD-10-CM | POA: Insufficient documentation

## 2020-07-31 DIAGNOSIS — F32A Depression, unspecified: Secondary | ICD-10-CM | POA: Insufficient documentation

## 2020-07-31 DIAGNOSIS — M1990 Unspecified osteoarthritis, unspecified site: Secondary | ICD-10-CM | POA: Insufficient documentation

## 2020-07-31 DIAGNOSIS — M4800 Spinal stenosis, site unspecified: Secondary | ICD-10-CM | POA: Insufficient documentation

## 2020-07-31 DIAGNOSIS — E785 Hyperlipidemia, unspecified: Secondary | ICD-10-CM | POA: Insufficient documentation

## 2020-07-31 DIAGNOSIS — R102 Pelvic and perineal pain: Secondary | ICD-10-CM | POA: Insufficient documentation

## 2020-07-31 DIAGNOSIS — M2550 Pain in unspecified joint: Secondary | ICD-10-CM | POA: Insufficient documentation

## 2020-07-31 DIAGNOSIS — G8929 Other chronic pain: Secondary | ICD-10-CM | POA: Insufficient documentation

## 2020-07-31 DIAGNOSIS — M542 Cervicalgia: Secondary | ICD-10-CM | POA: Insufficient documentation

## 2020-07-31 DIAGNOSIS — N809 Endometriosis, unspecified: Secondary | ICD-10-CM | POA: Insufficient documentation

## 2020-07-31 DIAGNOSIS — G43909 Migraine, unspecified, not intractable, without status migrainosus: Secondary | ICD-10-CM | POA: Insufficient documentation

## 2020-07-31 DIAGNOSIS — M549 Dorsalgia, unspecified: Secondary | ICD-10-CM | POA: Insufficient documentation

## 2020-07-31 DIAGNOSIS — J309 Allergic rhinitis, unspecified: Secondary | ICD-10-CM | POA: Insufficient documentation

## 2020-07-31 DIAGNOSIS — K219 Gastro-esophageal reflux disease without esophagitis: Secondary | ICD-10-CM | POA: Insufficient documentation

## 2020-07-31 DIAGNOSIS — F319 Bipolar disorder, unspecified: Secondary | ICD-10-CM | POA: Insufficient documentation

## 2020-07-31 DIAGNOSIS — A048 Other specified bacterial intestinal infections: Secondary | ICD-10-CM | POA: Insufficient documentation

## 2020-07-31 DIAGNOSIS — F419 Anxiety disorder, unspecified: Secondary | ICD-10-CM | POA: Insufficient documentation

## 2020-08-02 NOTE — Progress Notes (Signed)
Cardiology Office Note:    Date:  08/03/2020   ID:  Nancy Mcpherson, DOB Jul 06, 1975, MRN [id number]  PCP:  Patient, No Pcp Per (Inactive)  Cardiologist:  Norman Herrlich, MD   Referring MD: Hortencia Conradi, NP  ASSESSMENT:    1. Nonspecific abnormal electrocardiogram (ECG) (EKG)   2. Acute respiratory failure with hypoxia (HCC)    PLAN:    In order of problems listed above:  Her EKG is a variant of normal seen about 20% of individuals poor predictive accuracy for CAD previous infarction pattern is unchanged she has had normal coronary arteriography and at this time I would not advise further cardiac testing I gave her a copy of her EKG to hand carry in the future and told her to sign up for my chart and to get in touch with if issues arise again about her EKG pattern  Next appointment as needed   Medication Adjustments/Labs and Tests Ordered: Current medicines are reviewed at length with the patient today.  Concerns regarding medicines are outlined above.  Orders Placed This Encounter  Procedures   EKG 12-Lead    No orders of the defined types were placed in this encounter.    Chief Complaint  Patient presents with   Abnormal ECG    History of Present Illness:    Nancy Mcpherson is a 45 y.o. female who is being seen today for the evaluation of an abnormal EKG at the request of Hortencia Conradi, NP.  She had left heart catheterization performed 03/03/2015.  She had normal coronary arteriography left ventricular ejection fraction 65% with normal segmental left ventricular function and no mitral regurgitation.  I wPhysician) on 03/03/2015 16:10 Angiographic findings Cardiac Arteries and Lesion Findings LMCA: Normal. LAD: Normal. LCx: Normal. RCA: Normal. Ramus: Normal. Procedure Data Procedure Date Date: 03/03/2015 Start: 11:46 Contrast Material - Omnipaque52 ml Fluoroscopy Time: Diagnostic: 1:01 minutes. Total: 1:01 minutes. Medical History Risk Factors The  patient risk factors include:arterial hypertension, Last creatinine 0.7 mg/dl, Creatinine clearance 54.62 ml/min and Current/Recent(w/in 1 year) tobacco use. Admission Data Admission Date: 03/03/2015 Admission Status: Outpatient. Current Diagnosis: Stable angina. VA LV function assessed VO:[id number]. Ejection Fraction - Method: LV gram. EF%: 65. LVA Segment Contractility 1 - Normal 3 - Mild 5 - Severe 7 - Dyskinesis hypokinesis hypokinesis 2 - Hypokinesis 4 - Moderate 6 - Akinesis 8 - Aneurysm hypokinesisill cut-and-paste a copy of the report to the chart for completeness  Chest x-ray 05/03/2020 read as normal.  EKG same day performed at [facility] showed sinus rhythm left axis deviation right atrial enlargement QS in V2.  Possible anteroseptal MI versus lead placement.  EKG July 2015 was interpreted as poor R wave progression cannot exclude septal MI similar to this tracing. Normal sinus rhythm with sinus arrhythmia  Poor anterior R wave progression (cannot exclude septal MI, age undetermined)  nonspecific T wave abnormality  Abnormal ECG  No previous ECGs available  Confirmed by Leitha Bleak, M.D., CHARLES [phone]) on 08/02/2013 5:39:01 PM  She has good healthcare knowledge and literacy she has been EMS previously. She is unsettled by the EKG report she had done prior to cervical disc surgery remarking on previous myocardial infarction. Today's EKG shows poor R wave progression I gave her a copy of it and told her in the future if issue arises asked people to compare with mine. Reviewed the results of her previous coronary angiogram she is not having chest pain and there really is no reason to do further diagnostic  testing like an ischemia evaluation and with my interpretation of her EKG and knowing she had normal left ventricular function I coronary angiography I do not think she needs cardiac imaging like an echocardiogram. She is not having chest pain palpitation or syncope no  exertional shortness of breath.  Edema and she is comfortable with this approach. Past Medical History:  Diagnosis Date   Allergic rhinitis    Anxiety    Arthralgia    Bipolar 1 disorder, depressed (HCC)    Chronic back pain    Depression    DJD (degenerative joint disease)    Endometriosis    GERD (gastroesophageal reflux disease)    H. pylori infection    Hyperlipidemia    Lumbar disc herniation    Migraine    Neck pain    Pelvic pain in female    PTSD (post-traumatic stress disorder)    Sciatica    Spinal stenosis     Past Surgical History:  Procedure Laterality Date   ABDOMINAL HYSTERECTOMY     CERVICAL FUSION     CESAREAN SECTION     CHOLECYSTECTOMY     SHOULDER SURGERY Right     Current Medications: Current Meds  Medication Sig   acetaminophen (TYLENOL) 650 MG suppository Place 650 mg rectally every 4 (four) hours as needed for mild pain or moderate pain.   Biotin 29528 MCG TABS Take 1 tablet by mouth in the morning.   Calcium Carbonate-Vit D-Min (CALTRATE 600+D PLUS MINERALS) 600-800 MG-UNIT TABS Take 1 tablet by mouth daily.   gabapentin (NEURONTIN) 300 MG capsule Take 300 mg by mouth 2 (two) times daily.   oxcarbazepine (TRILEPTAL) 600 MG tablet Take 600 mg by mouth. TAKE 0.5 TABLET (300 MG) IN AM AND 1 TABLET (600 MG) AT NIGHT   topiramate (TOPAMAX) 100 MG tablet Take 100 mg by mouth at bedtime.   UBRELVY 100 MG TABS Take 1 tablet by mouth daily as needed for migraine.   venlafaxine XR (EFFEXOR-XR) 150 MG 24 hr capsule Take 150 mg by mouth daily.   ziprasidone (GEODON) 40 MG capsule Take 40 mg by mouth in the morning.   ziprasidone (GEODON) 80 MG capsule Take 80 mg by mouth at bedtime.     Allergies:   Patient has no known allergies.   Social History   Socioeconomic History   Marital status: Legally Separated    Spouse name: Not on file   Number of children: Not on file   Years of education: Not on file   Highest education level: Not on file   Occupational History   Not on file  Tobacco Use   Smoking status: Some Days    Packs/day: 1.00    Types: Cigarettes   Smokeless tobacco: Never  Vaping Use   Vaping Use: Every day  Substance and Sexual Activity   Alcohol use: Yes    Comment: occ   Drug use: No   Sexual activity: Never  Other Topics Concern   Not on file  Social History Narrative   ** Merged History Encounter **       Social Determinants of Health   Financial Resource Strain: Not on file  Food Insecurity: Not on file  Transportation Needs: Not on file  Physical Activity: Not on file  Stress: Not on file  Social Connections: Not on file     Family History: The patient's family history includes Congestive Heart Failure in her mother; Depression in her mother; Heart attack in her maternal  grandfather and mother; Heart disease in her mother; Hypertension in her mother; Migraines in her mother.  ROS:   ROS Please see the history of present illness.     All other systems reviewed and are negative.  EKGs/Labs/Other Studies Reviewed:    The following studies were reviewed today:   EKG:  EKG is  ordered today.  The ekg ordered today is personally reviewed and demonstrates sinus rhythm small R wave V1 V2 poor wave progression not diagnostic of previous MI  Recent Labs: No results found for requested labs within last 8760 hours.  Recent Lipid Panel No results found for: CHOL, TRIG, HDL, CHOLHDL, VLDL, LDLCALC, LDLDIRECT  Physical Exam:    VS:  BP 118/80 (BP Location: Left Arm, Patient Position: Sitting, Cuff Size: Normal)   Pulse 73   Ht 5\' 5"  (1.651 m)   Wt 140 lb 3.2 oz (63.6 kg)   SpO2 98%   BMI 23.33 kg/m     Wt Readings from Last 3 Encounters:  08/03/20 140 lb 3.2 oz (63.6 kg)     GEN:  Well nourished, well developed in no acute distress HEENT: Normal NECK: No JVD; No carotid bruits LYMPHATICS: No lymphadenopathy CARDIAC: RRR, no murmurs, rubs, gallops RESPIRATORY:  Clear to auscultation  without rales, wheezing or rhonchi  ABDOMEN: Soft, non-tender, non-distended MUSCULOSKELETAL:  No edema; No deformity  SKIN: Warm and dry NEUROLOGIC:  Alert and oriented x 3 PSYCHIATRIC:  Normal affect     Signed, 08/05/20, MD  08/03/2020 10:52 AM    [facility]

## 2020-08-03 ENCOUNTER — Other Ambulatory Visit: Payer: Self-pay

## 2020-08-03 ENCOUNTER — Ambulatory Visit (INDEPENDENT_AMBULATORY_CARE_PROVIDER_SITE_OTHER): Payer: Medicare Other | Admitting: Cardiology

## 2020-08-03 VITALS — BP 118/80 | HR 73 | Ht 65.0 in | Wt 140.2 lb

## 2020-08-03 DIAGNOSIS — R9431 Abnormal electrocardiogram [ECG] [EKG]: Secondary | ICD-10-CM | POA: Diagnosis not present

## 2020-08-03 DIAGNOSIS — J9601 Acute respiratory failure with hypoxia: Secondary | ICD-10-CM

## 2020-08-03 NOTE — Patient Instructions (Signed)

## 2022-01-21 HISTORY — PX: CARPAL TUNNEL RELEASE: SHX101

## 2023-08-18 ENCOUNTER — Encounter: Payer: Self-pay | Admitting: Physician Assistant

## 2023-10-13 NOTE — Progress Notes (Unsigned)
 Ellouise Console, PA-C [address] [place], [place]  [address] Phone: [phone]   Gastroenterology Consultation  Referring Provider:     No ref. provider found Primary Care Physician:  Patient, No Pcp Per Primary Gastroenterologist:  Ellouise Console, PA-C / Glendia Holt, MD  Reason for Consultation:     GERD, Nausea, Epigastric Pain        HPI:   Nancy Mcpherson is a 47 y.o. y/o female referred for consultation & management  by Patient, No Pcp Per.  She is here to evaluate GERD, nausea, and vomiting.  Current symptoms: Patient reports having a lot of acid reflux.  She has burning in her chest and throat with acid regurgitation.  Admits to nausea but no vomiting.  Has burning in the epigastrium.  She reports being diagnosed with ulcers in her 87s.  She has not seen a GI since then.  Has not been on PPI for many years.  She rarely takes NSAIDs and tries to avoid NSAIDs.  She reports early satiety and getting full easily.  Has history of bulimia.  Takes Zofran  as needed for nausea.  Also takes sucralfate and baking soda which helps her upper GI symptoms.  She has never had a colonoscopy.  She is due for for screening colonoscopy due to age.  Denies melena, hematochezia, or weight loss.  She has chronic constipation.  Recently tried fiber Gummies with little benefit.  She has had cholecystectomy.  History of GERD and H. pylori.  09/2023 brain MRI showed no acute abnormality.  Past Medical History:  Diagnosis Date   Allergic rhinitis    Anxiety    Arthralgia    Arthritis    Bipolar 1 disorder, depressed (HCC)    Chronic back pain    Depression    DJD (degenerative joint disease)    Endometriosis    GERD (gastroesophageal reflux disease)    H. pylori infection    Hyperlipidemia    Lumbar disc herniation    Migraine    Neck pain    Pelvic pain in female    PTSD (post-traumatic stress disorder)    Sciatica    Spinal stenosis     Past Surgical History:  Procedure  Laterality Date   ABDOMINAL HYSTERECTOMY     CARPAL TUNNEL RELEASE Bilateral 2024   CERVICAL FUSION     CESAREAN SECTION     x2   CHOLECYSTECTOMY     laporoscopy  2007   SHOULDER SURGERY Right     Prior to Admission medications   Medication Sig Start Date End Date Taking? Authorizing Provider  acetaminophen  (TYLENOL ) 650 MG suppository Place 650 mg rectally every 4 (four) hours as needed for mild pain or moderate pain.    [provider]  Biotin 89999 MCG TABS Take 1 tablet by mouth in the morning.    [provider]  Calcium Carbonate-Vit D-Min (CALTRATE 600+D PLUS MINERALS) 600-800 MG-UNIT TABS Take 1 tablet by mouth daily.    [provider]  gabapentin  (NEURONTIN ) 300 MG capsule Take 300 mg by mouth 2 (two) times daily.    [provider]  oxcarbazepine (TRILEPTAL) 600 MG tablet Take 600 mg by mouth. TAKE 0.5 TABLET (300 MG) IN AM AND 1 TABLET (600 MG) AT NIGHT    [provider]  topiramate  (TOPAMAX ) 100 MG tablet Take 100 mg by mouth at bedtime.    [provider]  UBRELVY 100 MG TABS Take 1 tablet by mouth  daily as needed for migraine. 07/19/20   [provider]  venlafaxine XR (EFFEXOR-XR) 150 MG 24 hr capsule Take 150 mg by mouth daily. 06/30/20   [provider]  ziprasidone (GEODON) 40 MG capsule Take 40 mg by mouth in the morning.    [provider]  ziprasidone (GEODON) 80 MG capsule Take 80 mg by mouth at bedtime.    [provider]    Family History  Problem Relation Age of Onset   Congestive Heart Failure Mother    Depression Mother    Migraines Mother    Hypertension Mother    Heart disease Mother    Heart attack Mother    Heart attack Maternal Grandfather    Colon cancer Neg Hx      Social History   Tobacco Use   Smoking status: Every Day    Current packs/day: 1.00    Types: Cigarettes   Smokeless tobacco: Never  Vaping Use   Vaping status: Former  Substance Use  Topics   Alcohol use: Not Currently   Drug use: Yes    Types: Other-see comments    Comment: THC edibles    Allergies as of 10/14/2023 - Review Complete 08/03/2020  Allergen Reaction Noted   Nsaids Tinitus 10/08/2023   Prednisone Other (See Comments) 10/14/2023    Review of Systems:    All systems reviewed and negative except where noted in HPI.   Physical Exam:  BP 104/78   Pulse 74   Ht 5' 4 (1.626 m)   Wt 141 lb (64 kg)   LMP  (LMP Unknown) Comment: pt  unresponsive  SpO2 99%   BMI 24.20 kg/m  No LMP recorded (lmp unknown). Patient has had a hysterectomy.  General:   Alert,  Well-developed, well-nourished, pleasant and cooperative in NAD Lungs:  Respirations even and unlabored.  Clear throughout to auscultation.   No wheezes, crackles, or rhonchi. No acute distress. Heart:  Regular rate and rhythm; no murmurs, clicks, rubs, or gallops. Abdomen:  Normal bowel sounds.  No bruits.  Soft, and non-distended without masses, hepatosplenomegaly or hernias noted.  Mild to moderate Epigastric Tenderness.  Rest of abdomen is not tender.  No guarding or rebound tenderness.    Neurologic:  Alert and oriented x3;  grossly normal neurologically. Psych:  Alert and cooperative. Normal mood and affect.  Imaging Studies: No results found.  Labs: CBC    Component Value Date/Time   HGB 15.3 (H) 11/25/2013 1518   HCT 45.0 11/25/2013 1518    CMP     Component Value Date/Time   NA 140 11/25/2013 1518   K 2.9 (LL) 11/25/2013 1518   CL 107 11/25/2013 1518   GLUCOSE 107 (H) 11/25/2013 1518   BUN 4 (L) 11/25/2013 1518   CREATININE 0.60 11/25/2013 1518    Assessment and Plan:   Nancy Mcpherson is a 48 y.o. y/o female has been referred for:   GERD - Rx Pantoprazole  40mg  once daily - Recommend Lifestyle Modifications to prevent Acid Reflux.  Rec. Avoid coffee, sodas, peppermint, garlic, onions, alcohol, citrus fruits, chocolate, tomatoes, fatty and spicey foods.  Avoid eating 2-3  hours before bedtime.    Nausea - Continue Zofran  sparingly as needed   Epigastric Pain; remote history of peptic ulcers and H. pylori per patient report. - Scheduling EGD I discussed risks of EGD with patient to include risk of bleeding, perforation, and risk of sedation.  Patient expressed understanding and agrees to proceed with EGD.  Consitpation - Start OTC Miralax  Powder: Mix 1 capful in 6 to 8 ounces of a drink once daily - Recommend high-fiber diet, 30 g of fiber daily - Eat fruits, vegetables, and whole grains - Drink 64 ounces of water / fluids daily.   Colon Cancer Screening - Scheduling Colonoscopy I discussed risks of colonoscopy with patient to include risk of bleeding, colon perforation, and risk of sedation.  Patient expressed understanding and agrees to proceed with colonoscopy.   Follow up 4 weeks after EGD and colonoscopy procedures with TG.  Ellouise Console, PA-C

## 2023-10-14 ENCOUNTER — Ambulatory Visit (INDEPENDENT_AMBULATORY_CARE_PROVIDER_SITE_OTHER): Admitting: Physician Assistant

## 2023-10-14 ENCOUNTER — Encounter: Payer: Self-pay | Admitting: Physician Assistant

## 2023-10-14 VITALS — BP 104/78 | HR 74 | Ht 64.0 in | Wt 141.0 lb

## 2023-10-14 DIAGNOSIS — K5900 Constipation, unspecified: Secondary | ICD-10-CM | POA: Diagnosis not present

## 2023-10-14 DIAGNOSIS — Z1211 Encounter for screening for malignant neoplasm of colon: Secondary | ICD-10-CM

## 2023-10-14 DIAGNOSIS — R110 Nausea: Secondary | ICD-10-CM

## 2023-10-14 DIAGNOSIS — K219 Gastro-esophageal reflux disease without esophagitis: Secondary | ICD-10-CM | POA: Diagnosis not present

## 2023-10-14 DIAGNOSIS — R1013 Epigastric pain: Secondary | ICD-10-CM

## 2023-10-14 MED ORDER — PANTOPRAZOLE SODIUM 40 MG PO TBEC: 40.0000 mg | DELAYED_RELEASE_TABLET | Freq: Every day | ORAL | 3 refills | Status: AC

## 2023-10-14 MED ORDER — NA SULFATE-K SULFATE-MG SULF 17.5-3.13-1.6 GM/177ML PO SOLN: 1.0000 | Freq: Once | ORAL | 0 refills | Status: AC

## 2023-10-14 MED ORDER — POLYETHYLENE GLYCOL 3350 17 GM/SCOOP PO POWD: 17.0000 g | Freq: Every day | ORAL | Status: AC

## 2023-10-14 NOTE — Patient Instructions (Addendum)
   For constipation: Start OTC Miralax  Powder Mix 1 capful in 6 to 8 ounces of a drink once daily  Recommend high-fiber diet, 30 g of fiber daily Eat fruits, vegetables, and whole grains Drink 64 ounces of water / fluids daily.   For Acid Reflux, I have sent a prescription for Pantoprazole  40mg  1 tablet once daily to your pharmacy.  Recommend Lifestyle Modifications to prevent Acid Reflux.  Rec. Avoid coffee, sodas, peppermint, garlic, onions, alcohol, citrus fruits, chocolate, tomatoes, fatty and spicey foods.  Avoid eating 2-3 hours before bedtime.    You have been scheduled for an Endoscopy and Colonoscopy. Please follow the written instructions given to you at your visit today.  If you use inhalers (even only as needed), please bring them with you on the day of your procedure.  DO NOT TAKE 7 DAYS PRIOR TO TEST- Trulicity (dulaglutide) Ozempic, Wegovy (semaglutide) Mounjaro (tirzepatide) Bydureon Bcise (exanatide extended release)  DO NOT TAKE 1 DAY PRIOR TO YOUR TEST Rybelsus (semaglutide) Adlyxin (lixisenatide) Victoza (liraglutide) Byetta (exanatide) ___________________________________________________________________________  Please follow up sooner if symptoms increase or worsen __________________________________________________________________________  Due to recent changes in healthcare laws, you may see the results of your imaging and laboratory studies on MyChart before your provider has had a chance to review them.  We understand that in some cases there may be results that are confusing or concerning to you. Not all laboratory results come back in the same time frame and the provider may be waiting for multiple results in order to interpret others.  Please give us  48 hours in order for your provider to thoroughly review all the results before contacting the office for clarification of your results.   Thank you for trusting me with your gastrointestinal care!   Ellouise Console, PA-C _______________________________________________________  If your blood pressure at your visit was 140/90 or greater, please contact your primary care physician to follow up on this.  _______________________________________________________  If you are age 60 or older, your body mass index should be between 23-30. Your Body mass index is 24.2 kg/m. If this is out of the aforementioned range listed, please consider follow up with your Primary Care Provider.  If you are age 9 or younger, your body mass index should be between 19-25. Your Body mass index is 24.2 kg/m. If this is out of the aformentioned range listed, please consider follow up with your Primary Care Provider.   ________________________________________________________  The [facility] providers would like to encourage you to use MYCHART to communicate with providers for non-urgent requests or questions.  Due to long hold times on the telephone, sending your provider a message by [facility] may be a faster and more efficient way to get a response.  Please allow 48 business hours for a response.  Please remember that this is for non-urgent requests.  _______________________________________________________

## 2023-10-20 NOTE — Progress Notes (Signed)
 Agree with the assessment and plan as outlined by Brigitte Canard, PA-C.

## 2023-11-05 ENCOUNTER — Encounter: Payer: Self-pay | Admitting: Gastroenterology

## 2023-11-13 ENCOUNTER — Encounter: Payer: Self-pay | Admitting: Gastroenterology

## 2023-11-13 ENCOUNTER — Ambulatory Visit: Admitting: Gastroenterology

## 2023-11-13 VITALS — BP 113/84 | HR 68 | Temp 97.9°F | Resp 14 | Ht 64.0 in | Wt 141.0 lb

## 2023-11-13 DIAGNOSIS — D123 Benign neoplasm of transverse colon: Secondary | ICD-10-CM

## 2023-11-13 DIAGNOSIS — K640 First degree hemorrhoids: Secondary | ICD-10-CM | POA: Diagnosis not present

## 2023-11-13 DIAGNOSIS — K2289 Other specified disease of esophagus: Secondary | ICD-10-CM

## 2023-11-13 DIAGNOSIS — K3189 Other diseases of stomach and duodenum: Secondary | ICD-10-CM | POA: Diagnosis not present

## 2023-11-13 DIAGNOSIS — R110 Nausea: Secondary | ICD-10-CM

## 2023-11-13 DIAGNOSIS — K5730 Diverticulosis of large intestine without perforation or abscess without bleeding: Secondary | ICD-10-CM | POA: Diagnosis not present

## 2023-11-13 DIAGNOSIS — K219 Gastro-esophageal reflux disease without esophagitis: Secondary | ICD-10-CM

## 2023-11-13 DIAGNOSIS — Z1211 Encounter for screening for malignant neoplasm of colon: Secondary | ICD-10-CM | POA: Diagnosis not present

## 2023-11-13 DIAGNOSIS — K319 Disease of stomach and duodenum, unspecified: Secondary | ICD-10-CM | POA: Diagnosis not present

## 2023-11-13 DIAGNOSIS — K5900 Constipation, unspecified: Secondary | ICD-10-CM

## 2023-11-13 DIAGNOSIS — K644 Residual hemorrhoidal skin tags: Secondary | ICD-10-CM

## 2023-11-13 MED ORDER — SODIUM CHLORIDE 0.9 % IV SOLN: 500.0000 mL | Freq: Once | INTRAVENOUS | Status: DC

## 2023-11-13 MED ORDER — SODIUM CHLORIDE 0.9 % IV SOLN
4.0000 mg | Freq: Once | INTRAVENOUS | Status: AC
  Administered 2023-11-13: 4 mg via INTRAVENOUS

## 2023-11-13 NOTE — Patient Instructions (Signed)
 YOU HAD AN ENDOSCOPIC PROCEDURE TODAY AT THE [facility]:   Refer to the procedure report that was given to you for any specific questions about what was found during the examination.  If the procedure report does not answer your questions, please call your gastroenterologist to clarify.  If you requested that your care partner not be given the details of your procedure findings, then the procedure report has been included in a sealed envelope for you to review at your convenience later.  YOU SHOULD EXPECT: Some feelings of bloating in the abdomen. Passage of more gas than usual.  Walking can help get rid of the air that was put into your GI tract during the procedure and reduce the bloating. If you had a lower endoscopy (such as a colonoscopy or flexible sigmoidoscopy) you may notice spotting of blood in your stool or on the toilet paper. If you underwent a bowel prep for your procedure, you may not have a normal bowel movement for a few days.  Please Note:  You might notice some irritation and congestion in your nose or some drainage.  This is from the oxygen used during your procedure.  There is no need for concern and it should clear up in a day or so.  SYMPTOMS TO REPORT IMMEDIATELY:  Following lower endoscopy (colonoscopy or flexible sigmoidoscopy):  Excessive amounts of blood in the stool  Significant tenderness or worsening of abdominal pains  Swelling of the abdomen that is new, acute  Fever of 100F or higher  Following upper endoscopy (EGD)  Vomiting of blood or coffee ground material  New chest pain or pain under the shoulder blades  Painful or persistently difficult swallowing  New shortness of breath  Fever of 100F or higher  Black, tarry-looking stools  Resume previous diet Continue present medications Await pathology results Handouts on polyps, diverticulosis and hemorrhoids given    For urgent or emergent issues, a gastroenterologist can be reached at any  hour by calling [phone]. Do not use MyChart messaging for urgent concerns.    DIET:  We do recommend a small meal at first, but then you may proceed to your regular diet.  Drink plenty of fluids but you should avoid alcoholic beverages for 24 hours.  ACTIVITY:  You should plan to take it easy for the rest of today and you should NOT DRIVE or use heavy machinery until tomorrow (because of the sedation medicines used during the test).    FOLLOW UP: Our staff will call the number listed on your records the next business day following your procedure.  We will call around 7:15- 8:00 am to check on you and address any questions or concerns that you may have regarding the information given to you following your procedure. If we do not reach you, we will leave a message.     If any biopsies were taken you will be contacted by phone or by letter within the next 1-3 weeks.  Please call us  at [phone] if you have not heard about the biopsies in 3 weeks.    SIGNATURES/CONFIDENTIALITY: You and/or your care partner have signed paperwork which will be entered into your electronic medical record.  These signatures attest to the fact that that the information above on your After Visit Summary has been reviewed and is understood.  Full responsibility of the confidentiality of this discharge information lies with you and/or your care-partner.

## 2023-11-13 NOTE — Op Note (Signed)
 [facility] Patient Name: Nancy Mcpherson Procedure Date: 11/13/2023 2:15 PM MRN: [id number] Endoscopist: Glendia E. Stacia , MD, [id number] Age: 48 Referring MD:  Date of Birth: 1975-12-02 Gender: Female Account #: [id number] Procedure:                Upper GI endoscopy Indications:              Epigastric abdominal pain, Esophageal reflux                            symptoms that persist despite appropriate therapy Medicines:                Monitored Anesthesia Care Procedure:                Pre-Anesthesia Assessment:                           - Prior to the procedure, a History and Physical                            was performed, and patient medications and                            allergies were reviewed. The patient's tolerance of                            previous anesthesia was also reviewed. The risks                            and benefits of the procedure and the sedation                            options and risks were discussed with the patient.                            All questions were answered, and informed consent                            was obtained. Prior Anticoagulants: The patient has                            taken no anticoagulant or antiplatelet agents. ASA                            Grade Assessment: II - A patient with mild systemic                            disease. After reviewing the risks and benefits,                            the patient was deemed in satisfactory condition to                            undergo the procedure.  After obtaining informed consent, the endoscope was                            passed under direct vision. Throughout the                            procedure, the patient's blood pressure, pulse, and                            oxygen saturations were monitored continuously. The                            GIF [id number] #[id number] was introduced through the                             mouth, and advanced to the second part of duodenum.                            The upper GI endoscopy was accomplished without                            difficulty. The patient tolerated the procedure                            well. Scope In: Scope Out: Findings:                 The Z-line was irregular with a few small islands                            of salmon-colored mucosa. Biopsies were taken with                            a cold forceps for histology. Estimated blood loss                            was minimal.                           The exam of the esophagus was otherwise normal.                           Scattered mild mucosal changes characterized by                            atrophy and granularity were found in the gastric                            body. Biopsies were taken with a cold forceps for                            Helicobacter pylori testing. Estimated blood loss                            was minimal.  The exam of the stomach was otherwise normal.                           The examined duodenum was normal. Complications:            No immediate complications. Estimated Blood Loss:     Estimated blood loss was minimal. Impression:               - Z-line irregular. Biopsied to exclude Barrett's                            esophagus.                           - Atrophic and granular mucosa in the gastric body.                            Biopsied to exclude H. pylori.                           - Normal examined duodenum.                           - No evidence of peptic ulcer disease. Recommendation:           - Patient has a contact number available for                            emergencies. The signs and symptoms of potential                            delayed complications were discussed with the                            patient. Return to normal activities tomorrow.                            Written discharge instructions were  provided to the                            patient.                           - Resume previous diet.                           - Continue present medications.                           - Await pathology results.                           - Further recommendations will be based on                            pathology results. Wilfrido Luedke E. Stacia, MD 11/13/2023 2:56:46 PM This report has been signed electronically.

## 2023-11-13 NOTE — Progress Notes (Signed)
 History and Physical Interval Note:  11/13/2023 2:08 PM  Nancy Mcpherson  has presented today for endoscopic procedure(s), with the diagnosis of  Encounter Diagnoses  Name Primary?   Gastroesophageal reflux disease, unspecified whether esophagitis present Yes   Constipation, unspecified constipation type    Nausea without vomiting   .  The various methods of evaluation and treatment have been discussed with the patient and/or family. After consideration of risks, benefits and other options for treatment, the patient has consented to  the endoscopic procedure(s).   The patient's history has been reviewed, patient examined, no change in status, stable for endoscopic procedure(s).  I have reviewed the patient's chart and labs.  Questions were answered to the patient's satisfaction.     Makala Fetterolf E. Stacia, MD [facility]

## 2023-11-13 NOTE — Progress Notes (Signed)
 Called to room to assist during endoscopic procedure.  Patient ID and intended procedure confirmed with present staff. Received instructions for my participation in the procedure from the performing physician.

## 2023-11-13 NOTE — Op Note (Signed)
 [facility] Patient Name: Nancy Mcpherson Procedure Date: 11/13/2023 2:14 PM MRN: [id number] Endoscopist: Glendia E. Stacia , MD, [id number] Age: 48 Referring MD:  Date of Birth: Jun 02, 1975 Gender: Female Account #: [id number] Procedure:                Colonoscopy Indications:              Screening for colorectal malignant neoplasm, This                            is the patient's first colonoscopy Medicines:                Monitored Anesthesia Care Procedure:                Pre-Anesthesia Assessment:                           - Prior to the procedure, a History and Physical                            was performed, and patient medications and                            allergies were reviewed. The patient's tolerance of                            previous anesthesia was also reviewed. The risks                            and benefits of the procedure and the sedation                            options and risks were discussed with the patient.                            All questions were answered, and informed consent                            was obtained. Prior Anticoagulants: The patient has                            taken no anticoagulant or antiplatelet agents. ASA                            Grade Assessment: II - A patient with mild systemic                            disease. After reviewing the risks and benefits,                            the patient was deemed in satisfactory condition to                            undergo the procedure.  After obtaining informed consent, the colonoscope                            was passed under direct vision. Throughout the                            procedure, the patient's blood pressure, pulse, and                            oxygen saturations were monitored continuously. The                            Olympus Scope SN: [id number] was introduced through                            the anus and  advanced to the the terminal ileum,                            with identification of the appendiceal orifice and                            IC valve. The colonoscopy was performed without                            difficulty. The patient tolerated the procedure                            well. The quality of the bowel preparation was                            adequate. The terminal ileum, ileocecal valve,                            appendiceal orifice, and rectum were photographed.                            The bowel preparation used was SUPREP via split                            dose instruction. Scope In: 2:29:57 PM Scope Out: 2:49:23 PM Scope Withdrawal Time: 0 hours 13 minutes 1 second  Total Procedure Duration: 0 hours 19 minutes 26 seconds  Findings:                 Skin tags were found on perianal exam.                           The digital rectal exam was normal. Pertinent                            negatives include normal sphincter tone and no                            palpable rectal lesions.  A 10 mm polyp was found in the transverse colon.                            The polyp was sessile. The polyp was removed with a                            cold snare. Resection and retrieval were complete.                            Estimated blood loss was minimal.                           Multiple medium-mouthed and small-mouthed                            diverticula were found in the sigmoid colon and                            descending colon.                           The exam was otherwise normal throughout the                            examined colon.                           The terminal ileum appeared normal.                           Non-bleeding internal hemorrhoids were found during                            retroflexion. The hemorrhoids were Grade I                            (internal hemorrhoids that do not prolapse).                            No additional abnormalities were found on                            retroflexion. Complications:            No immediate complications. Estimated Blood Loss:     Estimated blood loss was minimal. Impression:               - Perianal skin tags found on perianal exam.                           - One 10 mm polyp in the transverse colon, removed                            with a cold snare. Resected and retrieved.                           -  Moderate diverticulosis in the sigmoid colon and                            in the descending colon.                           - The examined portion of the ileum was normal.                           - Non-bleeding internal hemorrhoids. Recommendation:           - Patient has a contact number available for                            emergencies. The signs and symptoms of potential                            delayed complications were discussed with the                            patient. Return to normal activities tomorrow.                            Written discharge instructions were provided to the                            patient.                           - Resume previous diet.                           - Continue present medications.                           - Await pathology results.                           - Repeat colonoscopy (date not yet determined) for                            surveillance based on pathology results. Tanita Palinkas E. Stacia, MD 11/13/2023 3:01:15 PM This report has been signed electronically.

## 2023-11-13 NOTE — Progress Notes (Signed)
 Pt's states no medical or surgical changes since previsit or office visit.  Patient reports she is extremely nauseous. CRNA informed. Verbal order for Zofran  4mg  IV. 4mg  Zofran  IV given.

## 2023-11-13 NOTE — Progress Notes (Signed)
 Transferred to PACU via stretcher, arousing, VSS.

## 2023-11-14 ENCOUNTER — Telehealth: Payer: Self-pay | Admitting: *Deleted

## 2023-11-14 NOTE — Telephone Encounter (Signed)
  Follow up Call-     11/13/2023    1:21 PM  Call back number  Post procedure Call Back phone  # [phone]  Permission to leave phone message Yes     Patient questions:   Message left to call us  if necessary.

## 2023-11-18 LAB — SURGICAL PATHOLOGY

## 2023-11-25 ENCOUNTER — Ambulatory Visit: Payer: Self-pay | Admitting: Gastroenterology

## 2023-11-25 NOTE — Progress Notes (Signed)
 Nancy Mcpherson,   The polyp that I removed from your colon was completely benign but was proven to be a pre-cancerous polyp that MAY have grown into cancers if it had not been removed.  Studies shows that at least 20% of women over age 48 and 30% of men over age 7 have pre-cancerous polyps.  Based on current nationally recognized surveillance guidelines, I recommend that you have a repeat colonoscopy in 3 years.   The biopsies taken from your stomach were notable for mild reactive gastropathy which is a common finding and often related to use of certain medications (often NSAIDs), but there was no evidence of Helicobacter pylori infection. This common finding is not felt to necessarily be a cause of any particular symptom and there is no specific treatment or further evaluation recommended.  The biopsies of the junction between your esophagus and stomach showed reactive changes of acid reflux, but no precancerous changes (Barrett's esophagus).  If you are still having frequent reflux symptoms on the pantoprazole , we can try a different medication to see if it works better for you.  Making the dietary and behavioral modifications previously discussed may also help reduce reflux symptoms.  Is the pantoprazole  working well enough for you, or would you like to try a different medication?
